# Patient Record
Sex: Male | Born: 1990 | Race: Black or African American | Hispanic: No | Marital: Single | Smoking: Current every day smoker
Health system: Southern US, Community
[De-identification: ages and names within clinical notes are randomized; demographics above are authoritative.]

## PROBLEM LIST (undated history)

## (undated) ENCOUNTER — Ambulatory Visit: Payer: 59 | Source: Home / Self Care

---

## 2020-07-28 ENCOUNTER — Other Ambulatory Visit: Payer: Self-pay

## 2020-07-28 ENCOUNTER — Emergency Department (HOSPITAL_COMMUNITY): Payer: No Typology Code available for payment source

## 2020-07-28 ENCOUNTER — Inpatient Hospital Stay (HOSPITAL_COMMUNITY): Payer: No Typology Code available for payment source

## 2020-07-28 ENCOUNTER — Inpatient Hospital Stay (HOSPITAL_COMMUNITY)
Admission: EM | Admit: 2020-07-28 | Discharge: 2020-08-02 | DRG: 958 | Disposition: A | Discharge status: Home Health | Payer: No Typology Code available for payment source | Attending: Physician Assistant | Admitting: Physician Assistant

## 2020-07-28 ENCOUNTER — Encounter (HOSPITAL_COMMUNITY): Payer: Self-pay | Admitting: Student

## 2020-07-28 DIAGNOSIS — S270XXA Traumatic pneumothorax, initial encounter: Secondary | ICD-10-CM | POA: Diagnosis present

## 2020-07-28 DIAGNOSIS — K760 Fatty (change of) liver, not elsewhere classified: Secondary | ICD-10-CM | POA: Diagnosis present

## 2020-07-28 DIAGNOSIS — Z23 Encounter for immunization: Secondary | ICD-10-CM | POA: Diagnosis not present

## 2020-07-28 DIAGNOSIS — Z6841 Body Mass Index (BMI) 40.0 and over, adult: Secondary | ICD-10-CM | POA: Diagnosis not present

## 2020-07-28 DIAGNOSIS — S32421A Displaced fracture of posterior wall of right acetabulum, initial encounter for closed fracture: Secondary | ICD-10-CM | POA: Diagnosis present

## 2020-07-28 DIAGNOSIS — S01511A Laceration without foreign body of lip, initial encounter: Secondary | ICD-10-CM | POA: Diagnosis present

## 2020-07-28 DIAGNOSIS — S32409A Unspecified fracture of unspecified acetabulum, initial encounter for closed fracture: Secondary | ICD-10-CM

## 2020-07-28 DIAGNOSIS — S301XXA Contusion of abdominal wall, initial encounter: Secondary | ICD-10-CM | POA: Diagnosis present

## 2020-07-28 DIAGNOSIS — S32401A Unspecified fracture of right acetabulum, initial encounter for closed fracture: Secondary | ICD-10-CM

## 2020-07-28 DIAGNOSIS — Y92410 Unspecified street and highway as the place of occurrence of the external cause: Secondary | ICD-10-CM

## 2020-07-28 DIAGNOSIS — D62 Acute posthemorrhagic anemia: Secondary | ICD-10-CM | POA: Diagnosis not present

## 2020-07-28 DIAGNOSIS — G4730 Sleep apnea, unspecified: Secondary | ICD-10-CM | POA: Diagnosis present

## 2020-07-28 DIAGNOSIS — Z419 Encounter for procedure for purposes other than remedying health state, unspecified: Secondary | ICD-10-CM

## 2020-07-28 DIAGNOSIS — M25531 Pain in right wrist: Secondary | ICD-10-CM | POA: Diagnosis present

## 2020-07-28 DIAGNOSIS — S2249XA Multiple fractures of ribs, unspecified side, initial encounter for closed fracture: Secondary | ICD-10-CM | POA: Diagnosis present

## 2020-07-28 DIAGNOSIS — Z20822 Contact with and (suspected) exposure to covid-19: Secondary | ICD-10-CM | POA: Diagnosis present

## 2020-07-28 DIAGNOSIS — S2243XA Multiple fractures of ribs, bilateral, initial encounter for closed fracture: Secondary | ICD-10-CM | POA: Diagnosis present

## 2020-07-28 DIAGNOSIS — T1490XA Injury, unspecified, initial encounter: Secondary | ICD-10-CM

## 2020-07-28 DIAGNOSIS — S32461A Displaced associated transverse-posterior fracture of right acetabulum, initial encounter for closed fracture: Principal | ICD-10-CM | POA: Diagnosis present

## 2020-07-28 DIAGNOSIS — M79643 Pain in unspecified hand: Secondary | ICD-10-CM

## 2020-07-28 LAB — I-STAT CHEM 8, ED
BUN: 12 mg/dL (ref 6–20)
Calcium, Ion: 1.09 mmol/L — ABNORMAL LOW (ref 1.15–1.40)
Chloride: 107 mmol/L (ref 98–111)
Creatinine, Ser: 1.4 mg/dL — ABNORMAL HIGH (ref 0.61–1.24)
Glucose, Bld: 164 mg/dL — ABNORMAL HIGH (ref 70–99)
HCT: 42 % (ref 39.0–52.0)
Hemoglobin: 14.3 g/dL (ref 13.0–17.0)
Potassium: 4.2 mmol/L (ref 3.5–5.1)
Sodium: 141 mmol/L (ref 135–145)
TCO2: 22 mmol/L (ref 22–32)

## 2020-07-28 LAB — RESP PANEL BY RT-PCR (FLU A&B, COVID) ARPGX2
Influenza A by PCR: NEGATIVE
Influenza B by PCR: NEGATIVE
SARS Coronavirus 2 by RT PCR: NEGATIVE

## 2020-07-28 LAB — CBC
HCT: 42.7 % (ref 39.0–52.0)
HCT: 40.5 % (ref 39.0–52.0)
Hemoglobin: 13.5 g/dL (ref 13.0–17.0)
Hemoglobin: 13.1 g/dL (ref 13.0–17.0)
MCH: 28.8 pg (ref 26.0–34.0)
MCH: 28.6 pg (ref 26.0–34.0)
MCHC: 31.6 g/dL (ref 30.0–36.0)
MCHC: 32.3 g/dL (ref 30.0–36.0)
MCV: 91 fL (ref 80.0–100.0)
MCV: 88.4 fL (ref 80.0–100.0)
Platelets: 317 10*3/uL (ref 150–400)
Platelets: 288 10*3/uL (ref 150–400)
RBC: 4.69 MIL/uL (ref 4.22–5.81)
RBC: 4.58 MIL/uL (ref 4.22–5.81)
RDW: 14.3 % (ref 11.5–15.5)
RDW: 14.5 % (ref 11.5–15.5)
WBC: 9.9 10*3/uL (ref 4.0–10.5)
WBC: 10.7 10*3/uL — ABNORMAL HIGH (ref 4.0–10.5)
nRBC: 0 % (ref 0.0–0.2)
nRBC: 0 % (ref 0.0–0.2)

## 2020-07-28 LAB — URINALYSIS, ROUTINE W REFLEX MICROSCOPIC
Bacteria, UA: NONE SEEN
Bilirubin Urine: NEGATIVE
Glucose, UA: NEGATIVE mg/dL
Ketones, ur: 5 mg/dL — AB
Leukocytes,Ua: NEGATIVE
Nitrite: NEGATIVE
Protein, ur: NEGATIVE mg/dL
Specific Gravity, Urine: >1.046 — ABNORMAL HIGH (ref 1.005–1.030)
pH: 5 (ref 5.0–8.0)

## 2020-07-28 LAB — COMPREHENSIVE METABOLIC PANEL
ALT: 60 U/L — ABNORMAL HIGH (ref 0–44)
AST: 58 U/L — ABNORMAL HIGH (ref 15–41)
Albumin: 4 g/dL (ref 3.5–5.0)
Alkaline Phosphatase: 44 U/L (ref 38–126)
Anion gap: 12 (ref 5–15)
BUN: 12 mg/dL (ref 6–20)
CO2: 20 mmol/L — ABNORMAL LOW (ref 22–32)
Calcium: 8.8 mg/dL — ABNORMAL LOW (ref 8.9–10.3)
Chloride: 108 mmol/L (ref 98–111)
Creatinine, Ser: 1.28 mg/dL — ABNORMAL HIGH (ref 0.61–1.24)
GFR, Estimated: >60 mL/min (ref >60)
Glucose, Bld: 165 mg/dL — ABNORMAL HIGH (ref 70–99)
Potassium: 4.2 mmol/L (ref 3.5–5.1)
Sodium: 140 mmol/L (ref 135–145)
Total Bilirubin: 0.5 mg/dL (ref 0.3–1.2)
Total Protein: 6.5 g/dL (ref 6.5–8.1)

## 2020-07-28 LAB — RAPID URINE DRUG SCREEN, HOSP PERFORMED
Amphetamines: NOT DETECTED
Barbiturates: NOT DETECTED
Benzodiazepines: NOT DETECTED
Cocaine: NOT DETECTED
Opiates: POSITIVE — AB
Tetrahydrocannabinol: NOT DETECTED

## 2020-07-28 LAB — ETHANOL: Alcohol, Ethyl (B): 172 mg/dL — ABNORMAL HIGH (ref <10)

## 2020-07-28 LAB — CREATININE, SERUM
Creatinine, Ser: 1.06 mg/dL (ref 0.61–1.24)
GFR, Estimated: >60 mL/min (ref >60)

## 2020-07-28 LAB — LACTIC ACID, PLASMA
Lactic Acid, Venous: 3.7 mmol/L (ref 0.5–1.9)
Lactic Acid, Venous: 2.2 mmol/L (ref 0.5–1.9)

## 2020-07-28 LAB — PROTIME-INR
INR: 1 (ref 0.8–1.2)
Prothrombin Time: 13.3 seconds (ref 11.4–15.2)

## 2020-07-28 LAB — HIV ANTIBODY (ROUTINE TESTING W REFLEX): HIV Screen 4th Generation wRfx: NONREACTIVE

## 2020-07-28 LAB — SAMPLE TO BLOOD BANK

## 2020-07-28 MED ORDER — GABAPENTIN 600 MG PO TABS
300.0000 mg | ORAL_TABLET | Freq: Three times a day (TID) | ORAL | Status: DC
  Administered 2020-07-28 – 2020-07-31 (×10): 300 mg via ORAL
  Filled 2020-07-28 (×13): qty 1

## 2020-07-28 MED ORDER — OXYCODONE HCL 5 MG PO TABS: 5.0000 mg | ORAL_TABLET | ORAL | Status: DC | PRN

## 2020-07-28 MED ORDER — MORPHINE SULFATE (PF) 4 MG/ML IV SOLN
4.0000 mg | Freq: Once | INTRAVENOUS | Status: AC
Start: 2020-07-28 — End: 2020-07-28
  Administered 2020-07-28: 4 mg via INTRAVENOUS
  Filled 2020-07-28: qty 1

## 2020-07-28 MED ORDER — ONDANSETRON HCL 4 MG/2ML IJ SOLN
4.0000 mg | Freq: Once | INTRAMUSCULAR | Status: AC
  Administered 2020-07-28: 4 mg via INTRAVENOUS
  Filled 2020-07-28: qty 2

## 2020-07-28 MED ORDER — BISACODYL 10 MG RE SUPP: 10.0000 mg | Freq: Every day | RECTAL | Status: DC | PRN

## 2020-07-28 MED ORDER — TETANUS-DIPHTH-ACELL PERTUSSIS 5-2.5-18.5 LF-MCG/0.5 IM SUSY
0.5000 mL | PREFILLED_SYRINGE | Freq: Once | INTRAMUSCULAR | Status: AC
  Administered 2020-07-28: 0.5 mL via INTRAMUSCULAR
  Filled 2020-07-28: qty 0.5

## 2020-07-28 MED ORDER — HYDROMORPHONE HCL 1 MG/ML IJ SOLN: 0.5000 mg | INTRAMUSCULAR | Status: DC | PRN

## 2020-07-28 MED ORDER — SODIUM CHLORIDE 0.9 % IV BOLUS
125.0000 mL | Freq: Once | INTRAVENOUS | Status: AC
  Administered 2020-07-28: 125 mL via INTRAVENOUS

## 2020-07-28 MED ORDER — KETOROLAC TROMETHAMINE 15 MG/ML IJ SOLN
15.0000 mg | Freq: Three times a day (TID) | INTRAMUSCULAR | Status: DC
  Administered 2020-07-28 – 2020-08-02 (×14): 15 mg via INTRAVENOUS
  Filled 2020-07-28 (×15): qty 1

## 2020-07-28 MED ORDER — ONDANSETRON HCL 4 MG/2ML IJ SOLN: 4.0000 mg | Freq: Four times a day (QID) | INTRAMUSCULAR | Status: DC | PRN

## 2020-07-28 MED ORDER — IOHEXOL 300 MG/ML  SOLN
100.0000 mL | Freq: Once | INTRAMUSCULAR | Status: AC | PRN
  Administered 2020-07-28: 100 mL via INTRAVENOUS

## 2020-07-28 MED ORDER — ENOXAPARIN SODIUM 30 MG/0.3ML IJ SOSY
30.0000 mg | PREFILLED_SYRINGE | Freq: Two times a day (BID) | INTRAMUSCULAR | Status: DC
  Administered 2020-07-29 (×2): 30 mg via SUBCUTANEOUS
  Filled 2020-07-28 (×3): qty 0.3

## 2020-07-28 MED ORDER — ONDANSETRON 4 MG PO TBDP: 4.0000 mg | ORAL_TABLET | Freq: Four times a day (QID) | ORAL | Status: DC | PRN

## 2020-07-28 MED ORDER — ACETAMINOPHEN 325 MG PO TABS
650.0000 mg | ORAL_TABLET | Freq: Four times a day (QID) | ORAL | Status: DC
  Administered 2020-07-28 – 2020-08-02 (×18): 650 mg via ORAL
  Filled 2020-07-28 (×21): qty 2

## 2020-07-28 MED ORDER — SODIUM CHLORIDE 0.9 % IV BOLUS
1000.0000 mL | Freq: Once | INTRAVENOUS | Status: AC
  Administered 2020-07-28: 1000 mL via INTRAVENOUS

## 2020-07-28 MED ORDER — OXYCODONE HCL 5 MG PO TABS
10.0000 mg | ORAL_TABLET | ORAL | Status: DC | PRN
  Administered 2020-07-28 – 2020-07-30 (×5): 10 mg via ORAL
  Filled 2020-07-28 (×5): qty 2

## 2020-07-28 MED ORDER — LIDOCAINE-EPINEPHRINE (PF) 2 %-1:200000 IJ SOLN
10.0000 mL | Freq: Once | INTRAMUSCULAR | Status: AC
  Administered 2020-07-28: 10 mL
  Filled 2020-07-28: qty 20

## 2020-07-28 MED ORDER — DOCUSATE SODIUM 100 MG PO CAPS
100.0000 mg | ORAL_CAPSULE | Freq: Two times a day (BID) | ORAL | Status: DC
  Administered 2020-07-28 – 2020-07-30 (×5): 100 mg via ORAL
  Filled 2020-07-28 (×7): qty 1

## 2020-07-28 NOTE — ED Notes (Signed)
Attempted to call report
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
## 2020-07-28 NOTE — ED Notes (Signed)
Attempted to call report. No answer after 5 minutes on hold
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
## 2020-07-28 NOTE — ED Notes (Signed)
Jasmine wiggins [phone] fiance would like phone calls with updates
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
## 2020-07-29 ENCOUNTER — Inpatient Hospital Stay (HOSPITAL_COMMUNITY): Payer: No Typology Code available for payment source

## 2020-07-29 LAB — CBC
HCT: 40.7 % (ref 39.0–52.0)
Hemoglobin: 13 g/dL (ref 13.0–17.0)
MCH: 28.5 pg (ref 26.0–34.0)
MCHC: 31.9 g/dL (ref 30.0–36.0)
MCV: 89.3 fL (ref 80.0–100.0)
Platelets: 312 10*3/uL (ref 150–400)
RBC: 4.56 MIL/uL (ref 4.22–5.81)
RDW: 14.6 % (ref 11.5–15.5)
WBC: 8.5 10*3/uL (ref 4.0–10.5)
nRBC: 0 % (ref 0.0–0.2)

## 2020-07-29 LAB — BASIC METABOLIC PANEL
Anion gap: 6 (ref 5–15)
BUN: 10 mg/dL (ref 6–20)
CO2: 28 mmol/L (ref 22–32)
Calcium: 8.9 mg/dL (ref 8.9–10.3)
Chloride: 105 mmol/L (ref 98–111)
Creatinine, Ser: 0.99 mg/dL (ref 0.61–1.24)
GFR, Estimated: >60 mL/min (ref >60)
Glucose, Bld: 158 mg/dL — ABNORMAL HIGH (ref 70–99)
Potassium: 3.8 mmol/L (ref 3.5–5.1)
Sodium: 139 mmol/L (ref 135–145)

## 2020-07-29 MED ORDER — METHOCARBAMOL 750 MG PO TABS
750.0000 mg | ORAL_TABLET | Freq: Four times a day (QID) | ORAL | Status: DC | PRN
  Administered 2020-07-29 – 2020-07-31 (×4): 750 mg via ORAL
  Filled 2020-07-29 (×4): qty 1
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
## 2020-07-29 NOTE — Plan of Care (Signed)
  Problem: Education: Goal: Knowledge of General Education information will improve Description: Including pain rating scale, medication(s)/side effects and non-pharmacologic comfort measures Outcome: Progressing   Problem: Clinical Measurements: Goal: Will remain free from infection Outcome: Progressing Goal: Diagnostic test results will improve Outcome: Progressing   Problem: Coping: Goal: Level of anxiety will decrease Outcome: Progressing   Problem: Elimination: Goal: Will not experience complications related to urinary retention Outcome: Progressing

## 2020-07-30 ENCOUNTER — Encounter (HOSPITAL_COMMUNITY): Admission: EM | Disposition: A | Discharge status: Home Health | Payer: Self-pay | Source: Home / Self Care

## 2020-07-30 ENCOUNTER — Inpatient Hospital Stay (HOSPITAL_COMMUNITY): Payer: No Typology Code available for payment source | Admitting: Anesthesiology

## 2020-07-30 ENCOUNTER — Inpatient Hospital Stay (HOSPITAL_COMMUNITY): Payer: No Typology Code available for payment source

## 2020-07-30 DIAGNOSIS — S32461A Displaced associated transverse-posterior fracture of right acetabulum, initial encounter for closed fracture: Secondary | ICD-10-CM | POA: Diagnosis not present

## 2020-07-30 HISTORY — PX: OPEN REDUCTION INTERNAL FIXATION ACETABULUM FRACTURE POSTERIOR: SHX6833

## 2020-07-30 LAB — BASIC METABOLIC PANEL
Anion gap: 7 (ref 5–15)
BUN: 10 mg/dL (ref 6–20)
CO2: 27 mmol/L (ref 22–32)
Calcium: 8.8 mg/dL — ABNORMAL LOW (ref 8.9–10.3)
Chloride: 104 mmol/L (ref 98–111)
Creatinine, Ser: 1 mg/dL (ref 0.61–1.24)
GFR, Estimated: >60 mL/min (ref >60)
Glucose, Bld: 125 mg/dL — ABNORMAL HIGH (ref 70–99)
Potassium: 3.9 mmol/L (ref 3.5–5.1)
Sodium: 138 mmol/L (ref 135–145)

## 2020-07-30 LAB — CBC
HCT: 40.3 % (ref 39.0–52.0)
Hemoglobin: 13.3 g/dL (ref 13.0–17.0)
MCH: 29.4 pg (ref 26.0–34.0)
MCHC: 33 g/dL (ref 30.0–36.0)
MCV: 89.2 fL (ref 80.0–100.0)
Platelets: 291 10*3/uL (ref 150–400)
RBC: 4.52 MIL/uL (ref 4.22–5.81)
RDW: 14.1 % (ref 11.5–15.5)
WBC: 8.2 10*3/uL (ref 4.0–10.5)
nRBC: 0 % (ref 0.0–0.2)

## 2020-07-30 LAB — SURGICAL PCR SCREEN
MRSA, PCR: NEGATIVE
Staphylococcus aureus: NEGATIVE

## 2020-07-30 LAB — VITAMIN D 25 HYDROXY (VIT D DEFICIENCY, FRACTURES): Vit D, 25-Hydroxy: 10.33 ng/mL — ABNORMAL LOW (ref 30–100)

## 2020-07-30 SURGERY — OPEN REDUCTION INTERNAL FIXATION ACETABULUM FRACTURE POSTERIOR
Anesthesia: General | Laterality: Right

## 2020-07-30 MED ORDER — OXYCODONE HCL 5 MG/5ML PO SOLN: 5.0000 mg | Freq: Once | ORAL | Status: AC | PRN

## 2020-07-30 MED ORDER — ROCURONIUM BROMIDE 10 MG/ML (PF) SYRINGE
PREFILLED_SYRINGE | INTRAVENOUS | Status: AC
  Filled 2020-07-30: qty 10

## 2020-07-30 MED ORDER — PROPOFOL 10 MG/ML IV BOLUS
INTRAVENOUS | Status: AC
  Filled 2020-07-30: qty 40

## 2020-07-30 MED ORDER — DEXAMETHASONE SODIUM PHOSPHATE 10 MG/ML IJ SOLN
INTRAMUSCULAR | Status: DC | PRN
  Administered 2020-07-30: 10 mg via INTRAVENOUS

## 2020-07-30 MED ORDER — VANCOMYCIN HCL 1 G IV SOLR
INTRAVENOUS | Status: DC | PRN
  Administered 2020-07-30: 1000 mg via TOPICAL

## 2020-07-30 MED ORDER — ONDANSETRON HCL 4 MG/2ML IJ SOLN
INTRAMUSCULAR | Status: AC
  Filled 2020-07-30: qty 2

## 2020-07-30 MED ORDER — ONDANSETRON HCL 4 MG/2ML IJ SOLN
INTRAMUSCULAR | Status: DC | PRN
  Administered 2020-07-30: 4 mg via INTRAVENOUS

## 2020-07-30 MED ORDER — TRANEXAMIC ACID-NACL 1000-0.7 MG/100ML-% IV SOLN
1000.0000 mg | Freq: Once | INTRAVENOUS | Status: AC
  Administered 2020-07-30: 1000 mg via INTRAVENOUS
  Filled 2020-07-30: qty 100

## 2020-07-30 MED ORDER — AMISULPRIDE (ANTIEMETIC) 5 MG/2ML IV SOLN: 10.0000 mg | Freq: Once | INTRAVENOUS | Status: DC | PRN

## 2020-07-30 MED ORDER — SODIUM CHLORIDE 0.9 % IV SOLN: INTRAVENOUS | Status: DC

## 2020-07-30 MED ORDER — MIDAZOLAM HCL 2 MG/2ML IJ SOLN
INTRAMUSCULAR | Status: AC
  Filled 2020-07-30: qty 2

## 2020-07-30 MED ORDER — HYDROMORPHONE HCL 1 MG/ML IJ SOLN
0.5000 mg | INTRAMUSCULAR | Status: DC | PRN
  Administered 2020-07-30 – 2020-07-31 (×4): 1 mg via INTRAVENOUS
  Filled 2020-07-30 (×4): qty 1

## 2020-07-30 MED ORDER — MEPERIDINE HCL 25 MG/ML IJ SOLN: 6.2500 mg | INTRAMUSCULAR | Status: DC | PRN

## 2020-07-30 MED ORDER — PROMETHAZINE HCL 25 MG/ML IJ SOLN: 6.2500 mg | INTRAMUSCULAR | Status: DC | PRN

## 2020-07-30 MED ORDER — CEFAZOLIN SODIUM-DEXTROSE 2-4 GM/100ML-% IV SOLN
2.0000 g | Freq: Three times a day (TID) | INTRAVENOUS | Status: AC
  Administered 2020-07-30 – 2020-07-31 (×3): 2 g via INTRAVENOUS
  Filled 2020-07-30 (×3): qty 100

## 2020-07-30 MED ORDER — OXYCODONE HCL 5 MG PO TABS
10.0000 mg | ORAL_TABLET | ORAL | Status: DC | PRN
  Administered 2020-07-30: 10 mg via ORAL
  Administered 2020-08-01: 15 mg via ORAL
  Administered 2020-08-02: 10 mg via ORAL
  Filled 2020-07-30: qty 2
  Filled 2020-07-30: qty 3
  Filled 2020-07-30: qty 2

## 2020-07-30 MED ORDER — ONDANSETRON HCL 4 MG PO TABS: 4.0000 mg | ORAL_TABLET | Freq: Four times a day (QID) | ORAL | Status: DC | PRN

## 2020-07-30 MED ORDER — OXYCODONE HCL 5 MG PO TABS
ORAL_TABLET | ORAL | Status: AC
  Filled 2020-07-30: qty 1

## 2020-07-30 MED ORDER — LACTATED RINGERS IV SOLN: INTRAVENOUS | Status: DC | PRN

## 2020-07-30 MED ORDER — METOCLOPRAMIDE HCL 5 MG PO TABS
5.0000 mg | ORAL_TABLET | Freq: Three times a day (TID) | ORAL | Status: DC | PRN
Start: 2020-07-30 — End: 2020-08-02

## 2020-07-30 MED ORDER — ALBUMIN HUMAN 5 % IV SOLN: INTRAVENOUS | Status: DC | PRN

## 2020-07-30 MED ORDER — OXYCODONE HCL 5 MG PO TABS
5.0000 mg | ORAL_TABLET | Freq: Once | ORAL | Status: AC | PRN
  Administered 2020-07-30: 5 mg via ORAL

## 2020-07-30 MED ORDER — ARTIFICIAL TEARS OPHTHALMIC OINT
TOPICAL_OINTMENT | OPHTHALMIC | Status: DC | PRN
  Administered 2020-07-30: 1 via OPHTHALMIC

## 2020-07-30 MED ORDER — FENTANYL CITRATE (PF) 250 MCG/5ML IJ SOLN
INTRAMUSCULAR | Status: AC
  Filled 2020-07-30: qty 5

## 2020-07-30 MED ORDER — FENTANYL CITRATE (PF) 250 MCG/5ML IJ SOLN
INTRAMUSCULAR | Status: DC | PRN
  Administered 2020-07-30 (×2): 100 ug via INTRAVENOUS
  Administered 2020-07-30: 50 ug via INTRAVENOUS
  Administered 2020-07-30: 150 ug via INTRAVENOUS

## 2020-07-30 MED ORDER — CEFAZOLIN IN SODIUM CHLORIDE 3-0.9 GM/100ML-% IV SOLN
INTRAVENOUS | Status: AC
  Filled 2020-07-30: qty 100

## 2020-07-30 MED ORDER — VANCOMYCIN HCL 1000 MG IV SOLR
INTRAVENOUS | Status: AC
  Filled 2020-07-30: qty 1000

## 2020-07-30 MED ORDER — LIDOCAINE 2% (20 MG/ML) 5 ML SYRINGE
INTRAMUSCULAR | Status: DC | PRN
  Administered 2020-07-30: 100 mg via INTRAVENOUS

## 2020-07-30 MED ORDER — TRANEXAMIC ACID-NACL 1000-0.7 MG/100ML-% IV SOLN
INTRAVENOUS | Status: AC
  Filled 2020-07-30: qty 100

## 2020-07-30 MED ORDER — OXYCODONE HCL 5 MG PO TABS
5.0000 mg | ORAL_TABLET | ORAL | Status: DC | PRN
  Administered 2020-07-31 – 2020-08-01 (×4): 10 mg via ORAL
  Filled 2020-07-30 (×4): qty 2

## 2020-07-30 MED ORDER — HYDROMORPHONE HCL 1 MG/ML IJ SOLN
0.2500 mg | INTRAMUSCULAR | Status: DC | PRN
  Administered 2020-07-30 (×2): 0.5 mg via INTRAVENOUS

## 2020-07-30 MED ORDER — SUGAMMADEX SODIUM 500 MG/5ML IV SOLN
INTRAVENOUS | Status: DC | PRN
  Administered 2020-07-30: 300 mg via INTRAVENOUS

## 2020-07-30 MED ORDER — POLYETHYLENE GLYCOL 3350 17 G PO PACK
17.0000 g | PACK | Freq: Every day | ORAL | Status: DC | PRN
  Filled 2020-07-30: qty 1

## 2020-07-30 MED ORDER — METOCLOPRAMIDE HCL 5 MG/ML IJ SOLN: 5.0000 mg | Freq: Three times a day (TID) | INTRAMUSCULAR | Status: DC | PRN

## 2020-07-30 MED ORDER — MIDAZOLAM HCL 5 MG/5ML IJ SOLN
INTRAMUSCULAR | Status: DC | PRN
  Administered 2020-07-30: 2 mg via INTRAVENOUS

## 2020-07-30 MED ORDER — TRANEXAMIC ACID-NACL 1000-0.7 MG/100ML-% IV SOLN
INTRAVENOUS | Status: DC | PRN
  Administered 2020-07-30: 1000 mg via INTRAVENOUS

## 2020-07-30 MED ORDER — PROPOFOL 10 MG/ML IV BOLUS
INTRAVENOUS | Status: DC | PRN
  Administered 2020-07-30: 200 mg via INTRAVENOUS

## 2020-07-30 MED ORDER — DOCUSATE SODIUM 100 MG PO CAPS: 100.0000 mg | ORAL_CAPSULE | Freq: Two times a day (BID) | ORAL | Status: DC

## 2020-07-30 MED ORDER — ONDANSETRON HCL 4 MG/2ML IJ SOLN: 4.0000 mg | Freq: Four times a day (QID) | INTRAMUSCULAR | Status: DC | PRN

## 2020-07-30 MED ORDER — ROCURONIUM BROMIDE 100 MG/10ML IV SOLN
INTRAVENOUS | Status: DC | PRN
  Administered 2020-07-30: 80 mg via INTRAVENOUS
  Administered 2020-07-30 (×3): 20 mg via INTRAVENOUS

## 2020-07-30 MED ORDER — HYDROMORPHONE HCL 1 MG/ML IJ SOLN
INTRAMUSCULAR | Status: AC
  Filled 2020-07-30: qty 1

## 2020-07-30 MED ORDER — 0.9 % SODIUM CHLORIDE (POUR BTL) OPTIME
TOPICAL | Status: DC | PRN
  Administered 2020-07-30: 1000 mL

## 2020-07-30 MED ORDER — STERILE WATER FOR IRRIGATION IR SOLN
Status: DC | PRN
  Administered 2020-07-30: 1000 mL

## 2020-07-30 MED ORDER — PHENYLEPHRINE HCL-NACL 10-0.9 MG/250ML-% IV SOLN
INTRAVENOUS | Status: DC | PRN
  Administered 2020-07-30: 25 ug/min via INTRAVENOUS

## 2020-07-30 MED ORDER — DEXTROSE 5 % IV SOLN
INTRAVENOUS | Status: DC | PRN
  Administered 2020-07-30: 3 g via INTRAVENOUS

## 2020-07-30 MED ORDER — SUGAMMADEX SODIUM 500 MG/5ML IV SOLN
INTRAVENOUS | Status: AC
  Filled 2020-07-30: qty 5

## 2020-07-30 MED ORDER — DEXAMETHASONE SODIUM PHOSPHATE 10 MG/ML IJ SOLN
INTRAMUSCULAR | Status: AC
  Filled 2020-07-30: qty 1

## 2020-07-30 SURGICAL SUPPLY — 75 items
BIT DRILL FLUTED 2.5 (BIT) IMPLANT
BLADE CLIPPER SURG (BLADE) IMPLANT
BLADE SURG 11 STRL SS (BLADE) ×2 IMPLANT
BRUSH SCRUB EZ PLAIN DRY (MISCELLANEOUS) ×4 IMPLANT
CHLORAPREP W/TINT 26 (MISCELLANEOUS) ×4 IMPLANT
COVER SURGICAL LIGHT HANDLE (MISCELLANEOUS) ×2 IMPLANT
COVER WAND RF STERILE (DRAPES) IMPLANT
DEFOGGER ANTIFOG KIT (MISCELLANEOUS) ×2 IMPLANT
DRAPE C-ARM 42X72 X-RAY (DRAPES) ×2 IMPLANT
DRAPE C-ARMOR (DRAPES) ×2 IMPLANT
DRAPE INCISE IOBAN 85X60 (DRAPES) ×2 IMPLANT
DRAPE ORTHO SPLIT 77X108 STRL (DRAPES) ×2
DRAPE SURG 17X23 STRL (DRAPES) ×12 IMPLANT
DRAPE SURG ORHT 6 SPLT 77X108 (DRAPES) ×2 IMPLANT
DRAPE U-SHAPE 47X51 STRL (DRAPES) ×2 IMPLANT
DRILL BIT FLUTED 2.5 (BIT) ×2
DRSG MEPILEX BORDER 4X12 (GAUZE/BANDAGES/DRESSINGS) ×1 IMPLANT
DRSG MEPILEX BORDER 4X8 (GAUZE/BANDAGES/DRESSINGS) IMPLANT
ELECT BLADE 4.0 EZ CLEAN MEGAD (MISCELLANEOUS) ×2
ELECT BLADE 6.5 EXT (BLADE) IMPLANT
ELECT REM PT RETURN 9FT ADLT (ELECTROSURGICAL) ×2
ELECTRODE BLDE 4.0 EZ CLN MEGD (MISCELLANEOUS) ×1 IMPLANT
ELECTRODE REM PT RTRN 9FT ADLT (ELECTROSURGICAL) ×1 IMPLANT
GLOVE SURG ENC MOIS LTX SZ6.5 (GLOVE) ×6 IMPLANT
GLOVE SURG ENC MOIS LTX SZ7.5 (GLOVE) ×8 IMPLANT
GLOVE SURG UNDER POLY LF SZ6.5 (GLOVE) ×2 IMPLANT
GLOVE SURG UNDER POLY LF SZ7.5 (GLOVE) ×2 IMPLANT
GOWN STRL REUS W/ TWL LRG LVL3 (GOWN DISPOSABLE) ×2 IMPLANT
GOWN STRL REUS W/TWL LRG LVL3 (GOWN DISPOSABLE) ×2
GUIDEWIRE 2.0MM (WIRE) ×2 IMPLANT
HANDPIECE INTERPULSE COAX TIP (DISPOSABLE) ×1
KIT BASIN OR (CUSTOM PROCEDURE TRAY) ×2 IMPLANT
KIT TURNOVER KIT B (KITS) ×2 IMPLANT
MANIFOLD NEPTUNE II (INSTRUMENTS) ×2 IMPLANT
NS IRRIG 1000ML POUR BTL (IV SOLUTION) ×2 IMPLANT
PACK TOTAL JOINT (CUSTOM PROCEDURE TRAY) ×2 IMPLANT
PACK UNIVERSAL I (CUSTOM PROCEDURE TRAY) ×2 IMPLANT
PAD ARMBOARD 7.5X6 YLW CONV (MISCELLANEOUS) ×4 IMPLANT
PLATE BONE 91MM 7HOLE PELVIC (Plate) ×1 IMPLANT
PLATE BONE LOCK 65MM 5 HOLE (Plate) ×1 IMPLANT
SCREW CORTEX 3.5 24MM (Screw) ×2 IMPLANT
SCREW CORTEX 3.5 28MM (Screw) ×1 IMPLANT
SCREW CORTEX 3.5 30MM (Screw) ×4 IMPLANT
SCREW CORTEX 3.5 32MM (Screw) ×1 IMPLANT
SCREW CORTEX 3.5 34MM (Screw) ×1 IMPLANT
SCREW CORTEX 3.5 40MM (Screw) ×1 IMPLANT
SCREW LOCK CORT ST 3.5X24 (Screw) IMPLANT
SCREW LOCK CORT ST 3.5X28 (Screw) IMPLANT
SCREW LOCK CORT ST 3.5X30 (Screw) IMPLANT
SCREW LOCK CORT ST 3.5X32 (Screw) IMPLANT
SCREW LOCK CORT ST 3.5X34 (Screw) IMPLANT
SCREW LOCK CORT ST 3.5X40 (Screw) IMPLANT
SET HNDPC FAN SPRY TIP SCT (DISPOSABLE) ×1 IMPLANT
SPONGE LAP 18X18 RF (DISPOSABLE) IMPLANT
STAPLER VISISTAT 35W (STAPLE) IMPLANT
SUCTION FRAZIER HANDLE 10FR (MISCELLANEOUS) ×1
SUCTION TUBE FRAZIER 10FR DISP (MISCELLANEOUS) ×1 IMPLANT
SUT ETHILON 2 0 PSLX (SUTURE) ×4 IMPLANT
SUT FIBERWIRE #2 38 T-5 BLUE (SUTURE) ×4
SUT MNCRL AB 3-0 PS2 18 (SUTURE) ×2 IMPLANT
SUT MON AB 2-0 CT1 36 (SUTURE) ×2 IMPLANT
SUT VIC AB 0 CT1 27 (SUTURE) ×1
SUT VIC AB 0 CT1 27XBRD ANBCTR (SUTURE) ×1 IMPLANT
SUT VIC AB 1 CT1 18XCR BRD 8 (SUTURE) ×1 IMPLANT
SUT VIC AB 1 CT1 27 (SUTURE) ×1
SUT VIC AB 1 CT1 27XBRD ANBCTR (SUTURE) ×1 IMPLANT
SUT VIC AB 1 CT1 8-18 (SUTURE) ×1
SUT VIC AB 2-0 CT1 27 (SUTURE) ×1
SUT VIC AB 2-0 CT1 TAPERPNT 27 (SUTURE) ×1 IMPLANT
SUTURE FIBERWR #2 38 T-5 BLUE (SUTURE) IMPLANT
TOWEL GREEN STERILE (TOWEL DISPOSABLE) ×4 IMPLANT
TOWEL GREEN STERILE FF (TOWEL DISPOSABLE) ×4 IMPLANT
TRAY FOLEY MTR SLVR 16FR STAT (SET/KITS/TRAYS/PACK) IMPLANT
UNDERPAD 30X36 HEAVY ABSORB (UNDERPADS AND DIAPERS) ×2 IMPLANT
WATER STERILE IRR 1000ML POUR (IV SOLUTION) IMPLANT

## 2020-07-30 NOTE — Anesthesia Procedure Notes (Addendum)
Procedure Name: Intubation Date/Time: 07/30/2020 7:36 AM Performed by: Renford Dills, CRNA Pre-anesthesia Checklist: Patient identified, Emergency Drugs available, Suction available and Patient being monitored Patient Re-evaluated:Patient Re-evaluated prior to induction Oxygen Delivery Method: Circle System Utilized Preoxygenation: Pre-oxygenation with 100% oxygen Induction Type: IV induction Ventilation: Two handed mask ventilation required Tube type: Oral Tube size: 8.0 mm Number of attempts: 2 Airway Equipment and Method: Stylet, Oral airway and Video-laryngoscopy Placement Confirmation: ETT inserted through vocal cords under direct vision, positive ETCO2 and breath sounds checked- equal and bilateral Secured at: 23 cm Tube secured with: Tape Dental Injury: Teeth and Oropharynx as per pre-operative assessment  Comments: Laceration on lower lip present upon arrival to Preop area.

## 2020-07-30 NOTE — Progress Notes (Signed)
RT note. Patient stated  he will place self on cpap when ready for bed. Cpap all set up for patient to use tonight.
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
## 2020-07-30 NOTE — Progress Notes (Signed)
Call returned back from Dr. Aundra Millet ,orthopedic, regarding patient's status and mews. Informed to hold lovenox this shift. No new orders at this time.

## 2020-07-30 NOTE — Anesthesia Postprocedure Evaluation (Signed)
Anesthesia Post Note  Patient: Robert Taylor  Procedure(s) Performed: OPEN REDUCTION INTERNAL FIXATION ACETABULUM FRACTURE POSTERIOR (Right)     Patient location during evaluation: PACU Anesthesia Type: General Level of consciousness: awake and alert Pain management: pain level controlled Vital Signs Assessment: post-procedure vital signs reviewed and stable Respiratory status: spontaneous breathing, nonlabored ventilation and respiratory function stable Cardiovascular status: blood pressure returned to baseline and stable Postop Assessment: no apparent nausea or vomiting Anesthetic complications: no   No notable events documented.  Last Vitals:  Vitals:   07/30/20 1145 07/30/20 1200  BP: 135/77 139/68  Pulse: 95 89  Resp: (!) 21 (!) 26  Temp:    SpO2: 100% 96%    Last Pain:  Vitals:   07/30/20 1200  TempSrc:   PainSc: 7                  Lowella Curb
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
## 2020-07-30 NOTE — Anesthesia Preprocedure Evaluation (Signed)
Anesthesia Evaluation  Patient identified by MRN, date of birth, ID band Patient awake    Reviewed: Allergy & Precautions, NPO status , Patient's Chart, lab work & pertinent test results  Airway Mallampati: II  TM Distance: >3 FB Neck ROM: Full    Dental no notable dental hx.    Pulmonary neg pulmonary ROS,    Pulmonary exam normal breath sounds clear to auscultation       Cardiovascular negative cardio ROS Normal cardiovascular exam Rhythm:Regular Rate:Normal     Neuro/Psych negative neurological ROS  negative psych ROS   GI/Hepatic negative GI ROS, Neg liver ROS,   Endo/Other  Morbid obesity  Renal/GU negative Renal ROS  negative genitourinary   Musculoskeletal negative musculoskeletal ROS (+)   Abdominal (+) + obese,   Peds negative pediatric ROS (+)  Hematology negative hematology ROS (+)   Anesthesia Other Findings   Reproductive/Obstetrics negative OB ROS                             Anesthesia Physical Anesthesia Plan  ASA: 3  Anesthesia Plan: General   Post-op Pain Management:    Induction: Intravenous  PONV Risk Score and Plan: 2 and Ondansetron, Midazolam and Treatment may vary due to age or medical condition  Airway Management Planned: Oral ETT  Additional Equipment:   Intra-op Plan:   Post-operative Plan: Extubation in OR  Informed Consent: I have reviewed the patients History and Physical, chart, labs and discussed the procedure including the risks, benefits and alternatives for the proposed anesthesia with the patient or authorized representative who has indicated his/her understanding and acceptance.     Dental advisory given  Plan Discussed with: CRNA  Anesthesia Plan Comments:        Anesthesia Quick Evaluation  
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
## 2020-07-30 NOTE — Progress Notes (Signed)
Pt arrived on unit. Dressing moderately saturated. Reinforced dressing with foam dressing and medipore tape.

## 2020-07-30 NOTE — Progress Notes (Signed)
   07/30/20 2004  Assess: MEWS Score  Temp 98.4 F (36.9 C)  BP (!) 120/56 (correction)  Pulse Rate (!) 113  Resp 20  SpO2 98 % (correction)  O2 Device Nasal Cannula  O2 Flow Rate (L/min) 2 L/min  Assess: MEWS Score  MEWS Temp 0  MEWS Systolic 0  MEWS Pulse 2  MEWS RR 0  MEWS LOC 0  MEWS Score 2  MEWS Score Color Yellow  Assess: if the MEWS score is Yellow or Red  Were vital signs taken at a resting state? Yes  Focused Assessment Change from prior assessment (see assessment flowsheet)  Early Detection of Sepsis Score *See Row Information* Low  MEWS guidelines implemented *See Row Information* Yes  Treat  MEWS Interventions Administered scheduled meds/treatments  Pain Scale 0-10  Pain Score 5  Pain Type Acute pain  Pain Location Hip  Pain Orientation Right  Pain Descriptors / Indicators Aching  Pain Frequency Constant  Pain Onset With Activity  Patients Stated Pain Goal 3  Pain Intervention(s) Medication (See eMAR)  Take Vital Signs  Increase Vital Sign Frequency  Yellow: Q 2hr X 2 then Q 4hr X 2, if remains yellow, continue Q 4hrs  Escalate  MEWS: Escalate Yellow: discuss with charge nurse/RN and consider discussing with provider and RRT  Notify: Charge Nurse/RN  Name of Charge Nurse/RN Notified Celso,RN  Date Charge Nurse/RN Notified 07/30/20  Time Charge Nurse/RN Notified 2050  Notify: Provider  Provider Name/Title Dr J.Daniels  Date Provider Notified 07/30/20  Time Provider Notified 2045  Notification Type Page  Notification Reason Change in status  Provider response Other (Comment) (awaiting orders)
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
## 2020-07-30 NOTE — Transfer of Care (Signed)
Immediate Anesthesia Transfer of Care Note  Patient: Robert Taylor  Procedure(s) Performed: OPEN REDUCTION INTERNAL FIXATION ACETABULUM FRACTURE POSTERIOR (Right)  Patient Location: PACU  Anesthesia Type:General  Level of Consciousness: sedated  Airway & Oxygen Therapy: Patient Spontanous Breathing and Patient connected to face mask oxygen  Post-op Assessment: Report given to RN and Post -op Vital signs reviewed and stable  Post vital signs: Reviewed and stable  Last Vitals:  Vitals Value Taken Time  BP 134/74 07/30/20 1100  Temp    Pulse 86 07/30/20 1104  Resp 35 07/30/20 1104  SpO2 97 % 07/30/20 1104  Vitals shown include unvalidated device data.  Last Pain:  Vitals:   07/30/20 0639  TempSrc: Oral  PainSc:       Patients Stated Pain Goal: 3 (07/29/20 1022)  Complications: No notable events documented.

## 2020-07-31 ENCOUNTER — Encounter (HOSPITAL_COMMUNITY): Payer: Self-pay | Admitting: Student

## 2020-07-31 LAB — BASIC METABOLIC PANEL
Anion gap: 5 (ref 5–15)
BUN: 10 mg/dL (ref 6–20)
CO2: 27 mmol/L (ref 22–32)
Calcium: 8.6 mg/dL — ABNORMAL LOW (ref 8.9–10.3)
Chloride: 103 mmol/L (ref 98–111)
Creatinine, Ser: 1 mg/dL (ref 0.61–1.24)
GFR, Estimated: >60 mL/min (ref >60)
Glucose, Bld: 139 mg/dL — ABNORMAL HIGH (ref 70–99)
Potassium: 4.5 mmol/L (ref 3.5–5.1)
Sodium: 135 mmol/L (ref 135–145)

## 2020-07-31 LAB — CBC
HCT: 25.2 % — ABNORMAL LOW (ref 39.0–52.0)
Hemoglobin: 8.4 g/dL — ABNORMAL LOW (ref 13.0–17.0)
MCH: 29.8 pg (ref 26.0–34.0)
MCHC: 33.3 g/dL (ref 30.0–36.0)
MCV: 89.4 fL (ref 80.0–100.0)
Platelets: 235 10*3/uL (ref 150–400)
RBC: 2.82 MIL/uL — ABNORMAL LOW (ref 4.22–5.81)
RDW: 13.8 % (ref 11.5–15.5)
WBC: 9.4 10*3/uL (ref 4.0–10.5)
nRBC: 0 % (ref 0.0–0.2)

## 2020-07-31 MED ORDER — ENOXAPARIN SODIUM 30 MG/0.3ML IJ SOSY
30.0000 mg | PREFILLED_SYRINGE | Freq: Two times a day (BID) | INTRAMUSCULAR | Status: DC
Start: 2020-07-31 — End: 2020-07-31

## 2020-07-31 MED ORDER — VITAMIN D (ERGOCALCIFEROL) 1.25 MG (50000 UNIT) PO CAPS
50000.0000 [IU] | ORAL_CAPSULE | ORAL | Status: DC
  Administered 2020-07-31: 50000 [IU] via ORAL
  Filled 2020-07-31: qty 1

## 2020-07-31 MED ORDER — ENOXAPARIN SODIUM 30 MG/0.3ML IJ SOSY
30.0000 mg | PREFILLED_SYRINGE | Freq: Two times a day (BID) | INTRAMUSCULAR | Status: DC
  Administered 2020-08-01 – 2020-08-02 (×3): 30 mg via SUBCUTANEOUS
  Filled 2020-07-31 (×3): qty 0.3

## 2020-07-31 NOTE — Plan of Care (Signed)
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
## 2020-07-31 NOTE — Progress Notes (Signed)
RT note. Patient on cpap at this time
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
## 2020-07-31 NOTE — Progress Notes (Signed)
Foley catheter removed per order. No distress noted.

## 2020-07-31 NOTE — Progress Notes (Signed)
Orthopedic Tech Progress Note Patient Details:  Robert Taylor 03/13/1990 [id number]  Spoke with RN about patient needing an OVER HEAD FRAME WITH TRAPEZE, in house frames max weight it 113.3 kg (250)lbs.  Patient ID: Robert Taylor, male   DOB: [birth year], 30 y.o.   MRN: [id number]  Robert Taylor 07/31/2020, 7:50 AM
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
## 2020-08-01 DIAGNOSIS — S32461A Displaced associated transverse-posterior fracture of right acetabulum, initial encounter for closed fracture: Secondary | ICD-10-CM | POA: Diagnosis not present

## 2020-08-01 LAB — HEMOGLOBIN AND HEMATOCRIT, BLOOD
HCT: 22.7 % — ABNORMAL LOW (ref 39.0–52.0)
Hemoglobin: 7.6 g/dL — ABNORMAL LOW (ref 13.0–17.0)

## 2020-08-01 LAB — PREPARE RBC (CROSSMATCH)

## 2020-08-01 LAB — BASIC METABOLIC PANEL
Anion gap: 5 (ref 5–15)
BUN: 11 mg/dL (ref 6–20)
CO2: 27 mmol/L (ref 22–32)
Calcium: 8.5 mg/dL — ABNORMAL LOW (ref 8.9–10.3)
Chloride: 104 mmol/L (ref 98–111)
Creatinine, Ser: 0.84 mg/dL (ref 0.61–1.24)
GFR, Estimated: >60 mL/min (ref >60)
Glucose, Bld: 136 mg/dL — ABNORMAL HIGH (ref 70–99)
Potassium: 4.3 mmol/L (ref 3.5–5.1)
Sodium: 136 mmol/L (ref 135–145)

## 2020-08-01 LAB — CBC
HCT: 20.8 % — ABNORMAL LOW (ref 39.0–52.0)
Hemoglobin: 6.7 g/dL — CL (ref 13.0–17.0)
MCH: 29.1 pg (ref 26.0–34.0)
MCHC: 32.2 g/dL (ref 30.0–36.0)
MCV: 90.4 fL (ref 80.0–100.0)
Platelets: 265 10*3/uL (ref 150–400)
RBC: 2.3 MIL/uL — ABNORMAL LOW (ref 4.22–5.81)
RDW: 14.3 % (ref 11.5–15.5)
WBC: 9.7 10*3/uL (ref 4.0–10.5)
nRBC: 0 % (ref 0.0–0.2)

## 2020-08-01 LAB — ABO/RH: ABO/RH(D): A POS

## 2020-08-01 MED ORDER — SODIUM CHLORIDE 0.9% IV SOLUTION: Freq: Once | INTRAVENOUS | Status: DC

## 2020-08-01 MED ORDER — SODIUM CHLORIDE 0.9% IV SOLUTION: Freq: Once | INTRAVENOUS | Status: AC

## 2020-08-01 MED ORDER — GABAPENTIN 300 MG PO CAPS
300.0000 mg | ORAL_CAPSULE | Freq: Three times a day (TID) | ORAL | Status: DC
  Administered 2020-08-01 – 2020-08-02 (×4): 300 mg via ORAL
  Filled 2020-08-01 (×4): qty 1

## 2020-08-01 MED ORDER — WHITE PETROLATUM EX OINT
TOPICAL_OINTMENT | CUTANEOUS | Status: AC
  Filled 2020-08-01: qty 28.35

## 2020-08-01 NOTE — Progress Notes (Signed)
Patient had a much better day than yesterday. He states his pain is better controlled today with the current regimen. Patient tolerated PT well and has been up with his SO several times to the bathroom without complication. Hgb now 7.6.

## 2020-08-01 NOTE — TOC Progression Note (Signed)
Transition of Care [facility]) - Progression Note    Patient Details  Name: Robert Taylor MRN: [id number] Date of Birth: [birth year]  Transition of [facility]) CM/SW Contact  Glennon Mac, RN Phone Number: 08/01/2020, 1:47 PM  Clinical Narrative:  Able to secure home health PT/OT with [facility]. DME has been delivered to room.  Confirmed address/phone number with patient's spouse.       Expected Discharge Plan: Home w Home Health Services Barriers to Discharge: Continued Medical Work up  Expected Discharge Plan and Services Expected Discharge Plan: Home w Home Health Services   Discharge Planning Services: CM Consult Post Acute Care Choice: Home Health Living arrangements for the past 2 months: Single Family Home                 DME Arranged: 3-N-1, Walker rolling (Bariatric) DME Agency: [employer] Date DME Agency Contacted: 07/31/20 Time DME Agency Contacted: 1630 Representative spoke with at DME Agency: Velna Hatchet HH Arranged: PT, OT HH Agency: [facility]   Time [facility] Agency Contacted: 1346 Representative spoke with at [facility] Agency: Health visitor   Social Determinants of Health (SDOH) Interventions    Readmission Risk Interventions No flowsheet data found.  Quintella Baton, RN, BSN  Trauma/Neuro ICU Case Manager [phone]
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
## 2020-08-01 NOTE — Plan of Care (Signed)
  Problem: Education: Goal: Knowledge of General Education information will improve Description: Including pain rating scale, medication(s)/side effects and non-pharmacologic comfort measures Outcome: Progressing   Problem: Health Behavior/Discharge Planning: Goal: Ability to manage health-related needs will improve Outcome: Progressing   Problem: Clinical Measurements: Goal: Ability to maintain clinical measurements within normal limits will improve Outcome: Progressing Goal: Will remain free from infection Outcome: Progressing Goal: Diagnostic test results will improve Outcome: Progressing Goal: Respiratory complications will improve Outcome: Progressing Goal: Cardiovascular complication will be avoided Outcome: Progressing   Problem: Clinical Measurements: Goal: Will remain free from infection Outcome: Progressing   Problem: Activity: Goal: Risk for activity intolerance will decrease Outcome: Progressing   Problem: Nutrition: Goal: Adequate nutrition will be maintained Outcome: Progressing   Problem: Coping: Goal: Level of anxiety will decrease Outcome: Progressing   Problem: Elimination: Goal: Will not experience complications related to bowel motility Outcome: Progressing Goal: Will not experience complications related to urinary retention Outcome: Progressing
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
## 2020-08-01 NOTE — Progress Notes (Signed)
Patient using hospital CPAP independently. Will call if he needs help.
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
## 2020-08-02 ENCOUNTER — Other Ambulatory Visit: Payer: Self-pay | Admitting: Student

## 2020-08-02 ENCOUNTER — Encounter: Payer: Self-pay | Admitting: Student

## 2020-08-02 LAB — CBC
HCT: 21.7 % — ABNORMAL LOW (ref 39.0–52.0)
Hemoglobin: 7.2 g/dL — ABNORMAL LOW (ref 13.0–17.0)
MCH: 29.4 pg (ref 26.0–34.0)
MCHC: 33.2 g/dL (ref 30.0–36.0)
MCV: 88.6 fL (ref 80.0–100.0)
Platelets: 261 10*3/uL (ref 150–400)
RBC: 2.45 MIL/uL — ABNORMAL LOW (ref 4.22–5.81)
RDW: 15.3 % (ref 11.5–15.5)
WBC: 10.4 10*3/uL (ref 4.0–10.5)
nRBC: 0.5 % — ABNORMAL HIGH (ref 0.0–0.2)

## 2020-08-02 LAB — BASIC METABOLIC PANEL
Anion gap: 6 (ref 5–15)
BUN: 10 mg/dL (ref 6–20)
CO2: 29 mmol/L (ref 22–32)
Calcium: 8.8 mg/dL — ABNORMAL LOW (ref 8.9–10.3)
Chloride: 103 mmol/L (ref 98–111)
Creatinine, Ser: 0.83 mg/dL (ref 0.61–1.24)
GFR, Estimated: >60 mL/min (ref >60)
Glucose, Bld: 129 mg/dL — ABNORMAL HIGH (ref 70–99)
Potassium: 4.3 mmol/L (ref 3.5–5.1)
Sodium: 138 mmol/L (ref 135–145)

## 2020-08-02 MED ORDER — GABAPENTIN 300 MG PO CAPS
300.0000 mg | ORAL_CAPSULE | Freq: Three times a day (TID) | ORAL | 0 refills | Status: DC
Start: 2020-08-02 — End: 2020-09-19

## 2020-08-02 MED ORDER — ACETAMINOPHEN 325 MG PO TABS: 650.0000 mg | ORAL_TABLET | Freq: Four times a day (QID) | ORAL | Status: AC | PRN

## 2020-08-02 MED ORDER — OXYCODONE HCL 10 MG PO TABS: 10.0000 mg | ORAL_TABLET | Freq: Four times a day (QID) | ORAL | 0 refills | Status: DC | PRN

## 2020-08-02 MED ORDER — METHOCARBAMOL 750 MG PO TABS: 750.0000 mg | ORAL_TABLET | Freq: Four times a day (QID) | ORAL | 0 refills | Status: DC | PRN

## 2020-08-02 MED ORDER — VITAMIN D (ERGOCALCIFEROL) 1.25 MG (50000 UNIT) PO CAPS: 50000.0000 [IU] | ORAL_CAPSULE | ORAL | 0 refills | Status: AC
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
## 2020-08-02 NOTE — Plan of Care (Signed)
  Problem: Education: Goal: Knowledge of General Education information will improve Description: Including pain rating scale, medication(s)/side effects and non-pharmacologic comfort measures 08/02/2020 1050 by Quintella Baton, RN Outcome: Adequate for Discharge 08/02/2020 1050 by Quintella Baton, RN Outcome: Progressing   Problem: Health Behavior/Discharge Planning: Goal: Ability to manage health-related needs will improve 08/02/2020 1050 by Garlon Hatchet A, RN Outcome: Adequate for Discharge 08/02/2020 1050 by Garlon Hatchet A, RN Outcome: Progressing   Problem: Clinical Measurements: Goal: Ability to maintain clinical measurements within normal limits will improve 08/02/2020 1050 by Quintella Baton, RN Outcome: Adequate for Discharge 08/02/2020 1050 by Garlon Hatchet A, RN Outcome: Progressing Goal: Will remain free from infection 08/02/2020 1050 by Quintella Baton, RN Outcome: Adequate for Discharge 08/02/2020 1050 by Quintella Baton, RN Outcome: Progressing Goal: Diagnostic test results will improve 08/02/2020 1050 by Quintella Baton, RN Outcome: Adequate for Discharge 08/02/2020 1050 by Garlon Hatchet A, RN Outcome: Progressing Goal: Respiratory complications will improve 08/02/2020 1050 by Quintella Baton, RN Outcome: Adequate for Discharge 08/02/2020 1050 by Garlon Hatchet A, RN Outcome: Progressing Goal: Cardiovascular complication will be avoided 08/02/2020 1050 by Quintella Baton, RN Outcome: Adequate for Discharge 08/02/2020 1050 by Quintella Baton, RN Outcome: Progressing   Problem: Activity: Goal: Risk for activity intolerance will decrease 08/02/2020 1050 by Quintella Baton, RN Outcome: Adequate for Discharge 08/02/2020 1050 by Garlon Hatchet A, RN Outcome: Progressing   Problem: Nutrition: Goal: Adequate nutrition will be maintained 08/02/2020 1050 by Quintella Baton, RN Outcome: Adequate for Discharge 08/02/2020 1050 by Garlon Hatchet A, RN Outcome:  Progressing   Problem: Coping: Goal: Level of anxiety will decrease 08/02/2020 1050 by Garlon Hatchet A, RN Outcome: Adequate for Discharge 08/02/2020 1050 by Garlon Hatchet A, RN Outcome: Progressing   Problem: Elimination: Goal: Will not experience complications related to bowel motility 08/02/2020 1050 by Quintella Baton, RN Outcome: Adequate for Discharge 08/02/2020 1050 by Garlon Hatchet A, RN Outcome: Progressing Goal: Will not experience complications related to urinary retention 08/02/2020 1050 by Quintella Baton, RN Outcome: Adequate for Discharge 08/02/2020 1050 by Quintella Baton, RN Outcome: Progressing   Problem: Pain Managment: Goal: General experience of comfort will improve 08/02/2020 1050 by Quintella Baton, RN Outcome: Adequate for Discharge 08/02/2020 1050 by Garlon Hatchet A, RN Outcome: Progressing   Problem: Safety: Goal: Ability to remain free from injury will improve 08/02/2020 1050 by Quintella Baton, RN Outcome: Adequate for Discharge 08/02/2020 1050 by Garlon Hatchet A, RN Outcome: Progressing   Problem: Skin Integrity: Goal: Risk for impaired skin integrity will decrease 08/02/2020 1050 by Quintella Baton, RN Outcome: Adequate for Discharge 08/02/2020 1050 by Quintella Baton, RN Outcome: Progressing

## 2020-08-02 NOTE — Discharge Instructions (Signed)
Find out which primary care providers in this area are covered by your insurance carrier if you do not already have a PCP. You will need to establish care to get a referral for formal sleep study for your sleep apnea. They can also help with pain management from rib fractures moving forward.

## 2020-08-02 NOTE — Plan of Care (Signed)
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
## 2020-08-03 LAB — TYPE AND SCREEN
ABO/RH(D): A POS
Antibody Screen: NEGATIVE
Unit division: 0
Unit division: 0

## 2020-08-03 LAB — BPAM RBC
Blood Product Expiration Date: 202207112359
Blood Product Expiration Date: 202207222359
ISSUE DATE / TIME: 202206290947
Unit Type and Rh: 6200
Unit Type and Rh: 6200

## 2020-08-20 ENCOUNTER — Ambulatory Visit: Payer: 59 | Admitting: Internal Medicine

## 2020-08-24 ENCOUNTER — Ambulatory Visit: Payer: 59 | Admitting: Family Medicine

## 2020-09-19 ENCOUNTER — Ambulatory Visit (INDEPENDENT_AMBULATORY_CARE_PROVIDER_SITE_OTHER): Payer: 59 | Admitting: Internal Medicine

## 2020-09-19 ENCOUNTER — Other Ambulatory Visit: Payer: Self-pay

## 2020-09-19 VITALS — BP 136/75 | HR 96 | Temp 97.9°F | Resp 18 | Ht 69.0 in | Wt 290.0 lb

## 2020-09-19 DIAGNOSIS — Z6841 Body Mass Index (BMI) 40.0 and over, adult: Secondary | ICD-10-CM

## 2020-09-19 DIAGNOSIS — F17218 Nicotine dependence, cigarettes, with other nicotine-induced disorders: Secondary | ICD-10-CM

## 2020-09-19 DIAGNOSIS — G4710 Hypersomnia, unspecified: Secondary | ICD-10-CM
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
## 2020-09-19 NOTE — Patient Instructions (Signed)
Hypersomnia Hypersomnia is a condition in which a person feels very tired during the day even though he or she gets plenty of sleep at night. A person with this condition may take naps during the day and may find it very difficult to wake up from sleep. Hypersomnia may affect a person's ability to think, concentrate,drive, or remember things. What are the causes? The cause of this condition may not be known. Possible causes include: Certain medicines. Sleep disorders, such as narcolepsy and sleep apnea. Injury to the head, brain, or spinal cord. Drug or alcohol use. Gastroesophageal reflux disease (GERD). Tumors. Certain medical conditions, such as depression, diabetes, or an underactive thyroid gland (hypothyroidism). What are the signs or symptoms? The main symptoms of hypersomnia include: Feeling very tired throughout the day, regardless of how much sleep you got the night before. Having trouble waking up. Others may find it difficult to wake you up when you are sleeping. Sleeping for longer and longer periods at a time. Taking naps throughout the day. Other symptoms may include: Feeling restless, anxious, or annoyed. Lacking energy. Having trouble with: Remembering. Speaking. Thinking. Loss of appetite. Seeing, hearing, tasting, smelling, or feeling things that are not real (hallucinations). How is this diagnosed? This condition may be diagnosed based on: Your symptoms and medical history. Your sleeping habits. Your health care provider may ask you to write down your sleeping habits in a daily sleep log, along with any symptoms you have. A series of tests that are done while you sleep (sleep study or polysomnogram). A test that measures how quickly you can fall asleep during the day (daytime nap study or multiple sleep latency test). How is this treated? Treatment can help you manage your condition. Treatment may include: Following a regular sleep routine. Lifestyle changes,  such as changing your eating habits, getting regular exercise, and avoiding alcohol or caffeinated beverages. Taking medicines to make you more alert (stimulants) during the day. Treating any underlying medical causes of hypersomnia. Follow these instructions at home: Sleep routine  Schedule the same bedtime and wake-up time each day. Practice a relaxing bedtime routine. This may include reading, meditation, deep breathing, or taking a warm bath before going to sleep. Get regular exercise each day. Avoid strenuous exercise in the evening hours. Keep your sleep environment at a cooler temperature, darkened, and quiet. Sleep with pillows and a mattress that are comfortable and supportive. Schedule short 20-minute naps for when you feel sleepiest during the day. Talk with your employer or teachers about your hypersomnia. If possible, adjust your schedule so that: You have a regular daytime work schedule. You can take a scheduled nap during the day. You do not have to work or be active at night. Do not eat a heavy meal for a few hours before bedtime. Eat your meals at about the same times every day. Avoid drinking alcohol or caffeinated beverages.  Safety  Do not drive or use heavy machinery if you are sleepy. Ask your health care provider if it is safe for you to drive. Wear a life jacket when swimming or spending time near water.  General instructions Take supplements and over-the-counter and prescription medicines only as told by your health care provider. Keep a sleep log that will help your doctor manage your condition. This may include information about: What time you go to bed each night. How often you wake up at night. How many hours you sleep at night. How often and for how long you nap during the   day. Any observations from others, such as leg movements during sleep, sleep walking, or snoring. Keep all follow-up visits as told by your health care provider. This is  important. Contact a health care provider if: You have new symptoms. Your symptoms get worse. Get help right away if: You have serious thoughts about hurting yourself or someone else. If you ever feel like you may hurt yourself or others, or have thoughts about taking your own life, get help right away. You can go to your nearest emergency department or call: Your local emergency services (911 in the [place] A suicide crisis helpline, such as the National Suicide Prevention Lifeline at 1-800-273-8255. This is open 24 hours a day. Summary Hypersomnia refers to a condition in which you feel very tired during the day even though you get plenty of sleep at night. A person with this condition may take naps during the day and may find it very difficult to wake up from sleep. Hypersomnia may affect a person's ability to think, concentrate, drive, or remember things. Treatment, such as following a regular sleep routine and making some lifestyle changes, can help you manage your condition. This information is not intended to replace advice given to you by your health care provider. Make sure you discuss any questions you have with your healthcare provider. Document Revised: 12/01/2019 Document Reviewed: 12/01/2019 Elsevier Patient Education  2022 Elsevier Inc.  

## 2022-02-25 ENCOUNTER — Ambulatory Visit: Payer: 59 | Admitting: Family

## 2022-02-25 NOTE — Progress Notes (Deleted)
   New Patient Office Visit  Subjective:  Patient ID: Robert Taylor, male    DOB: 1990/08/21  Age: 32 y.o. MRN: [id number]  CC: No chief complaint on file.   HPI Robert Taylor presents for establishing care today.  Assessment & Plan:   Problem List Items Addressed This Visit   None   Subjective:    Outpatient Medications Prior to Visit  Medication Sig Dispense Refill  . acetaminophen (TYLENOL) 325 MG tablet Take 2 tablets (650 mg total) by mouth every 6 (six) hours as needed for mild pain or headache.    . Vitamin D, Ergocalciferol, (DRISDOL) 1.25 MG (50000 UNIT) CAPS capsule Take 1 capsule (50,000 Units total) by mouth every 7 (seven) days. 5 capsule 0   No facility-administered medications prior to visit.   No past medical history on file. Past Surgical History:  Procedure Laterality Date  . OPEN REDUCTION INTERNAL FIXATION ACETABULUM FRACTURE POSTERIOR Right 07/30/2020   Procedure: OPEN REDUCTION INTERNAL FIXATION ACETABULUM FRACTURE POSTERIOR;  Surgeon: Shona Needles, MD;  Location: [facility];  Service: Orthopedics;  Laterality: Right;    Objective:   Today's Vitals: There were no vitals taken for this visit.  Physical Exam Vitals and nursing note reviewed.  Constitutional:      General: He is not in acute distress.    Appearance: Normal appearance.  HENT:     Head: Normocephalic.  Cardiovascular:     Rate and Rhythm: Normal rate and regular rhythm.  Pulmonary:     Effort: Pulmonary effort is normal.     Breath sounds: Normal breath sounds.  Musculoskeletal:        General: Normal range of motion.     Cervical back: Normal range of motion.  Skin:    General: Skin is warm and dry.  Neurological:     Mental Status: He is alert and oriented to person, place, and time.  Psychiatric:        Mood and Affect: Mood normal.   No orders of the defined types were placed in this encounter.   Jeanie Sewer, NP

## 2022-04-01 ENCOUNTER — Ambulatory Visit: Payer: 59 | Admitting: Emergency Medicine

## 2022-05-06 ENCOUNTER — Telehealth: Payer: 59 | Admitting: Family Medicine

## 2022-05-06 ENCOUNTER — Telehealth: Payer: 59

## 2022-05-06 DIAGNOSIS — J069 Acute upper respiratory infection, unspecified: Secondary | ICD-10-CM | POA: Diagnosis not present

## 2022-05-06 MED ORDER — FLUTICASONE PROPIONATE 50 MCG/ACT NA SUSP: 2.0000 | Freq: Every day | NASAL | 0 refills | Status: DC

## 2022-05-06 MED ORDER — PSEUDOEPH-BROMPHEN-DM 30-2-10 MG/5ML PO SYRP: 5.0000 mL | ORAL_SOLUTION | Freq: Four times a day (QID) | ORAL | 0 refills | Status: AC | PRN

## 2022-05-06 MED ORDER — BENZONATATE 100 MG PO CAPS: 100.0000 mg | ORAL_CAPSULE | Freq: Three times a day (TID) | ORAL | 0 refills | Status: DC | PRN

## 2022-05-06 NOTE — Patient Instructions (Addendum)
Robert Taylor, thank you for joining Perlie Mayo, NP for today's virtual visit.  While this provider is not your primary care provider (PCP), if your PCP is located in our provider database this encounter information will be shared with them immediately following your visit.   [place] account gives you access to today's visit and all your visits, tests, and labs performed at [facility] " click here if you don't have a [facility] account or go to [email or web address]  Consent: (Patient) Robert Taylor provided verbal consent for this virtual visit at the beginning of the encounter.  Current Medications:  Current Outpatient Medications:    benzonatate (TESSALON) 100 MG capsule, Take 1 capsule (100 mg total) by mouth 3 (three) times daily as needed for cough., Disp: 30 capsule, Rfl: 0   brompheniramine-pseudoephedrine-DM 30-2-10 MG/5ML syrup, Take 5 mLs by mouth 4 (four) times daily as needed., Disp: 120 mL, Rfl: 0   fluticasone (FLONASE) 50 MCG/ACT nasal spray, Place 2 sprays into both nostrils daily., Disp: 16 g, Rfl: 0   acetaminophen (TYLENOL) 325 MG tablet, Take 2 tablets (650 mg total) by mouth every 6 (six) hours as needed for mild pain or headache., Disp: , Rfl:    Vitamin D, Ergocalciferol, (DRISDOL) 1.25 MG (50000 UNIT) CAPS capsule, Take 1 capsule (50,000 Units total) by mouth every 7 (seven) days., Disp: 5 capsule, Rfl: 0   Medications ordered in this encounter:  Meds ordered this encounter  Medications   brompheniramine-pseudoephedrine-DM 30-2-10 MG/5ML syrup    Sig: Take 5 mLs by mouth 4 (four) times daily as needed.    Dispense:  120 mL    Refill:  0    Order Specific Question:   Supervising Provider    Answer:   Chase Picket [id number]   fluticasone (FLONASE) 50 MCG/ACT nasal spray    Sig: Place 2 sprays into both nostrils daily.    Dispense:  16 g    Refill:  0    Order Specific Question:   Supervising Provider    Answer:    Chase Picket [id number]   benzonatate (TESSALON) 100 MG capsule    Sig: Take 1 capsule (100 mg total) by mouth 3 (three) times daily as needed for cough.    Dispense:  30 capsule    Refill:  0    Order Specific Question:   Supervising Provider    Answer:   Chase Picket [id number]     *If you need refills on other medications prior to your next appointment, please contact your pharmacy*  Follow-Up: Call back or seek an in-person evaluation if the symptoms worsen or if the condition fails to improve as anticipated.  [place] [phone]  Other Instructions  Allergies, Adult An allergy is when your body reacts to something that bothers it (allergen). Allergies often affect the nose, eyes, skin, and stomach. They cannot spread from person to person. Allergies can start at any age. Sometimes, they go away as you get older. What are the causes? Outdoor things, like pollen, car fumes, and mold. Indoor things, like dust, smoke, mold, and pets. Foods. Medicines. Things that bother your skin. These include perfumes and bug bites. What increases the risk? Having family members with allergies or asthma. What are the signs or symptoms? Allergies may cause: A runny nose, stuffy nose, or sneezing. An itchy mouth, ears, or throat. A feeling of mucus dripping down the back of your throat. A sore throat.  Eyes that itch or are red, watery, or puffy. A skin rash or red, swollen areas of skin (hives). Stomach cramps or bloating. If you have a very bad allergic reaction (anaphylactic reaction) to food, medicine, or bug bites, you may also have: A red face. Coughing. Swollen lips, tongue, or mouth. A tight or swollen throat. Chest pain. Your chest may feel tight. Your heart may beat too fast. Trouble breathing. Pain in your belly (abdomen). You may vomit or have watery poop (diarrhea). Fainting. Or you may feel dizzy. If you have a very bad reaction to an allergy,  you should get help right away. How is this treated?     You may need to use: Cold, wet cloths. These can help with itching and swelling. Eye drops, nose sprays, or skin creams. A saline solution to wash out your nose. Saline solutions are made of salt and water. A humidifier. Medicines. You may need to change the foods you eat. You may be given allergy shots, or a small bit of the allergen may be put under your tongue. These treatments can help your body get used to the allergen. If you have a very bad allergic reaction, you may need to use an auto-injector pen. An auto-injector pen is a device filled with medicine. It gives you an emergency shot of epinephrine. Your doctor will teach you how to use it. Follow these instructions at home: Medicines  Take or apply over-the-counter and prescription medicines only as told by your doctor. Keep an auto-injector pen with you all the time if you might have a very bad allergic reaction. Eating and drinking Follow instructions from your doctor about what you may eat and drink. Drink enough fluid to keep your pee (urine) pale yellow. General instructions If you have ever had a bad reaction, wear a medical alert bracelet or necklace. Stay away from the things you are allergic to. Keep all follow-up visits. Your doctor will check on how you are doing and talk about treatment options with you. Contact a doctor if: You do not get better with treatment. Get help right away if: You have a very bad allergic reaction. You use your auto-injector pen. You must go to the hospital even if the medicine seems to be working. These symptoms may be an emergency. Use the auto-injector pen right away. Then call 911. Do not wait to see if the symptoms will go away. Do not drive yourself to the hospital. This information is not intended to replace advice given to you by your health care provider. Make sure you discuss any questions you have with your health  care provider. Document Revised: 10/02/2021 Document Reviewed: 10/02/2021 Elsevier Patient Education  [place].   If you have been instructed to have an in-person evaluation today at a local Urgent Care facility, please use the link below. It will take you to a list of all of our available [facility], including address, phone number and hours of operation. Please do not delay care.  [facility]  If you or a family member do not have a primary care provider, use the link below to schedule a visit and establish care. When you choose a [facility] primary care physician or advanced practice provider, you gain a long-term partner in health. Find a Primary Care Provider  Learn more about 's in-office and virtual care options: [facility]
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
## 2022-05-07 ENCOUNTER — Telehealth: Payer: 59 | Admitting: Nurse Practitioner

## 2022-05-07 DIAGNOSIS — J111 Influenza due to unidentified influenza virus with other respiratory manifestations: Secondary | ICD-10-CM | POA: Diagnosis not present

## 2022-05-07 MED ORDER — OSELTAMIVIR PHOSPHATE 75 MG PO CAPS: 75.0000 mg | ORAL_CAPSULE | Freq: Two times a day (BID) | ORAL | 0 refills | Status: AC
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
## 2022-07-11 ENCOUNTER — Ambulatory Visit: Payer: 59 | Admitting: Family Medicine

## 2022-08-12 ENCOUNTER — Ambulatory Visit: Payer: 59 | Admitting: Internal Medicine

## 2022-08-28 ENCOUNTER — Ambulatory Visit: Payer: 59 | Admitting: Internal Medicine
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
## 2023-01-31 IMAGING — RF DG C-ARM 1-60 MIN
1 series · 7 of 7 positions shown · non-contrast
Comparison: July 28, 2020.

CLINICAL DATA: Open reduction and internal fixation of right
acetabular fracture.

EXAM:
JUDET PELVIS - 3+ VIEW; DG C-ARM 1-60 MIN
Radiation exposure index: 26.19 mGy.

[Series 1: run · 7 of 7 slices shown]
[im 1/7]
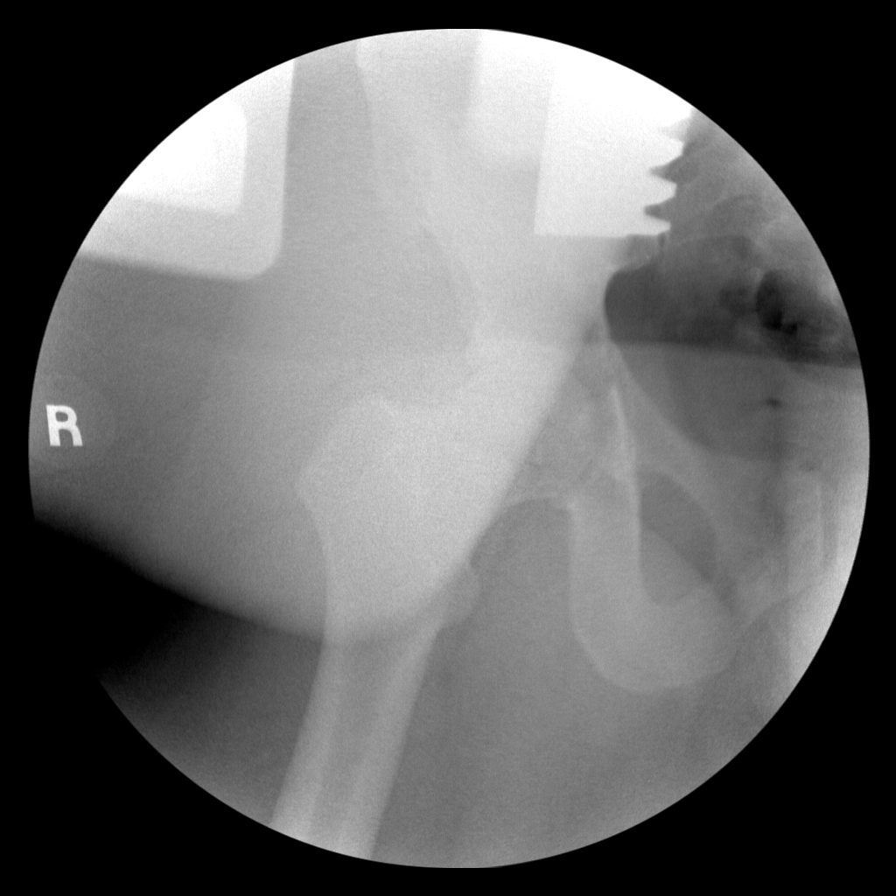
[im 2/7]
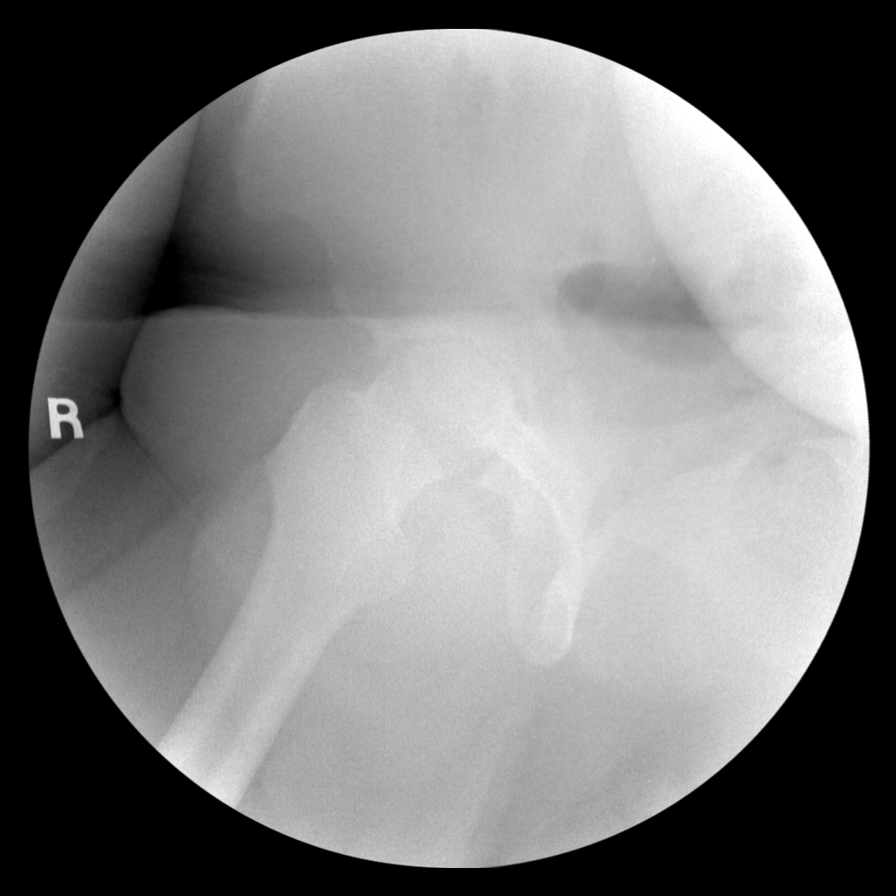
[im 3/7]
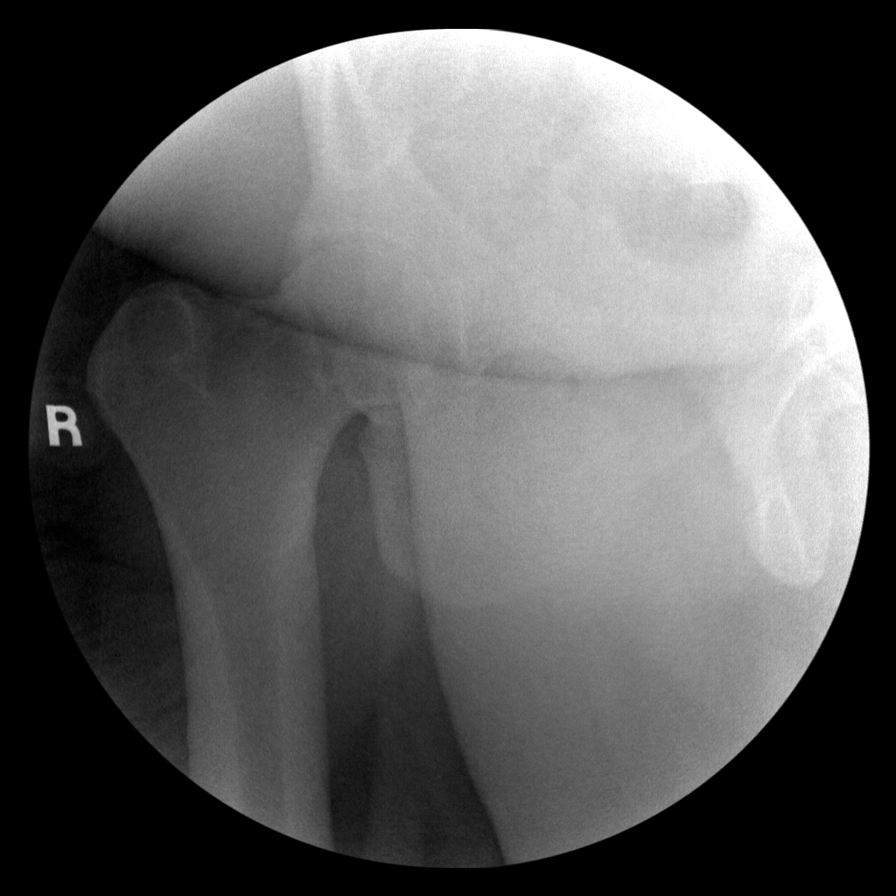
[im 4/7]
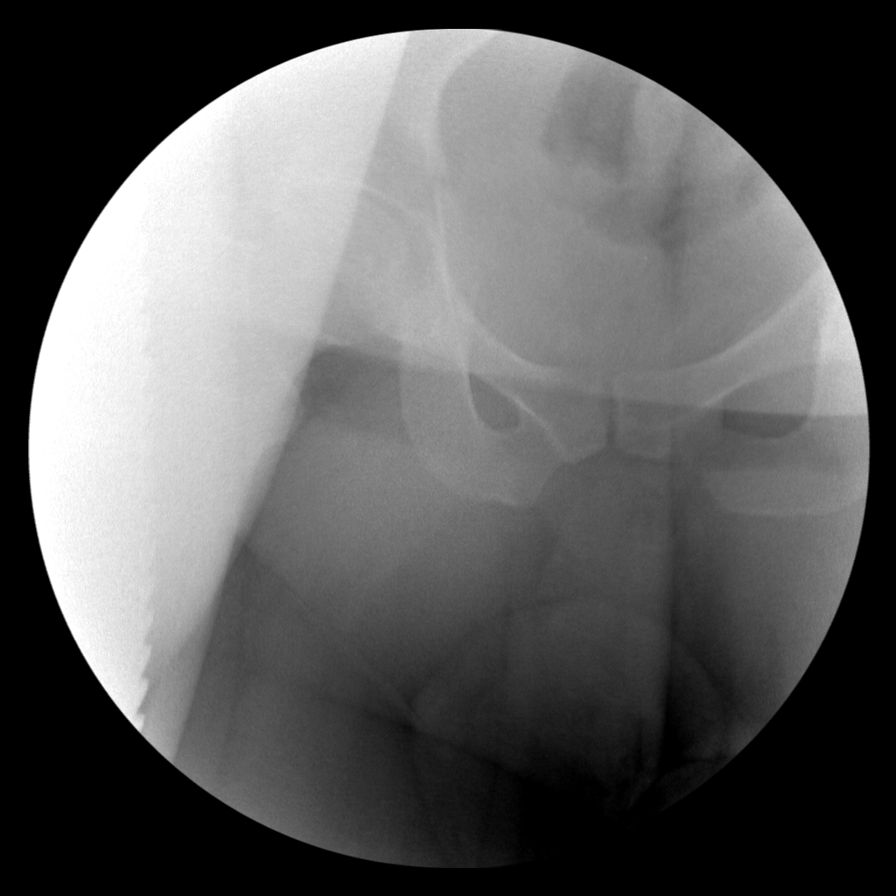
[im 5/7]
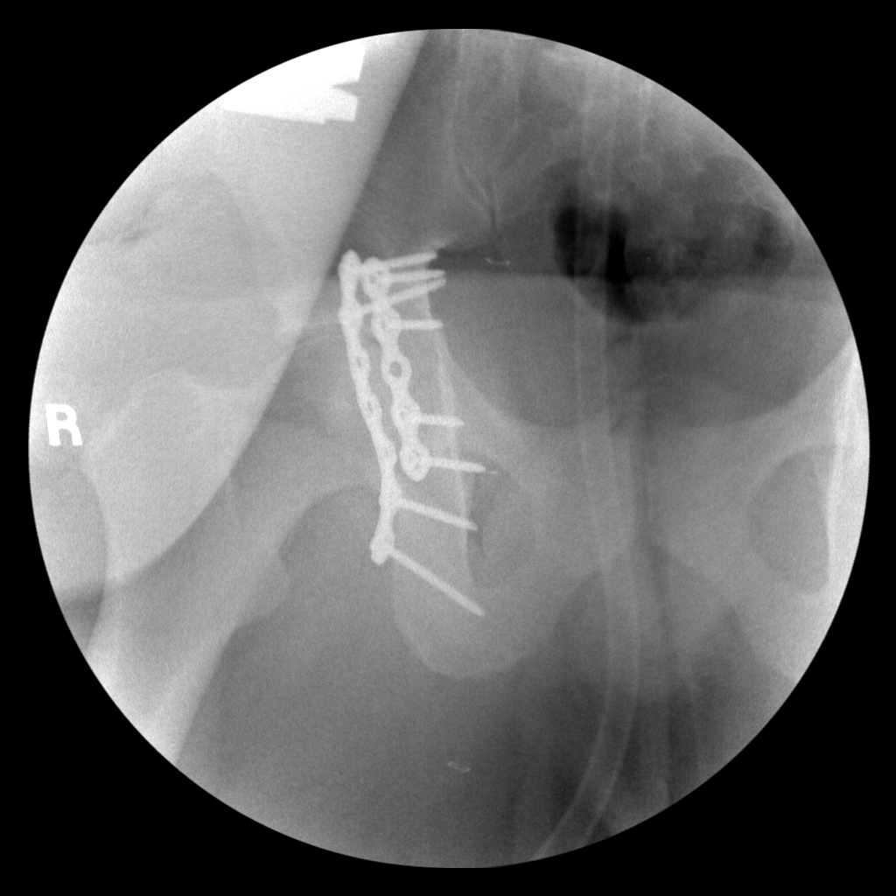
[im 6/7]
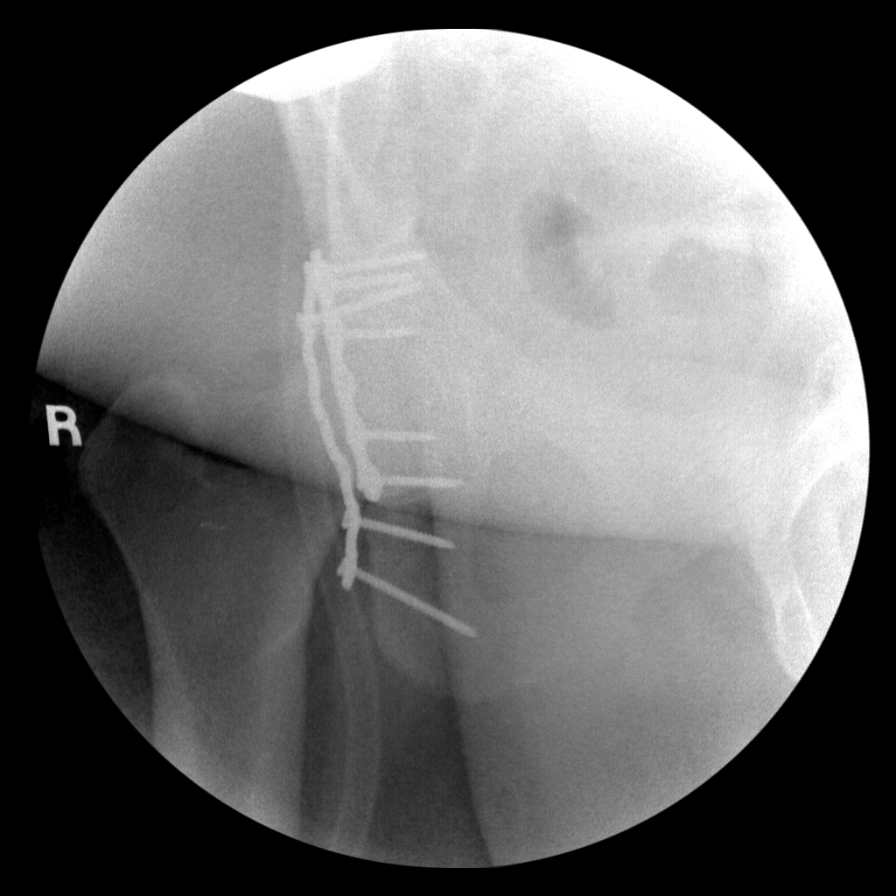
[im 7/7]
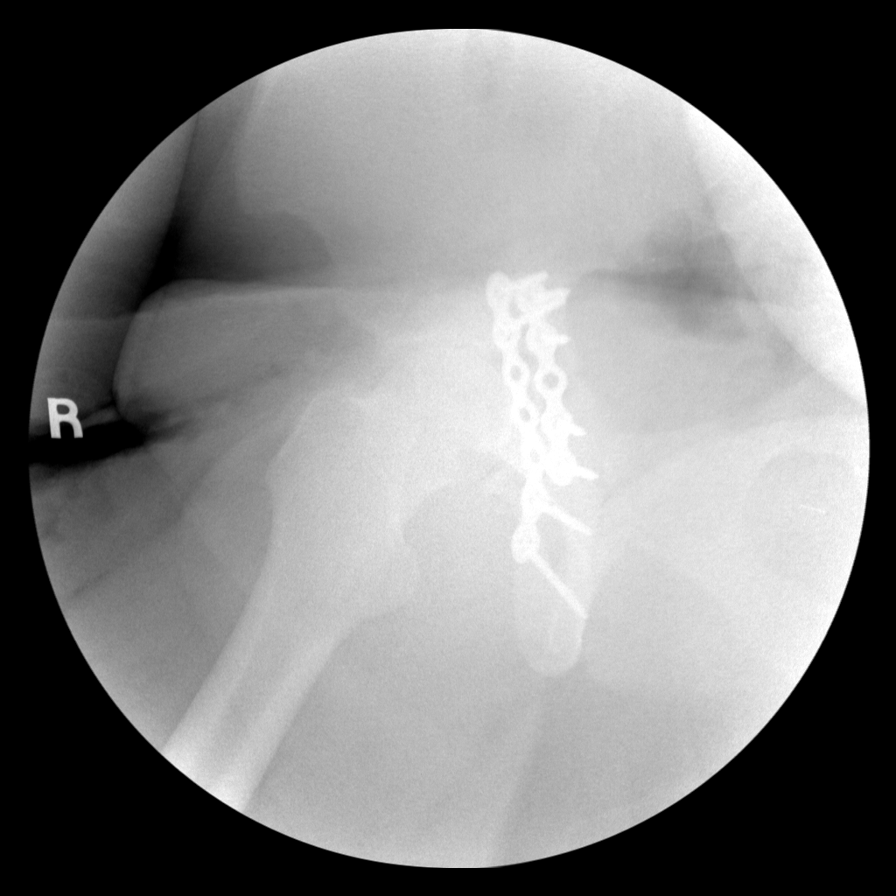

[7 of 7 positions shown; findings below may reference images not displayed]

FINDINGS: Seven intraoperative fluoroscopic images were obtained of the right
pelvis. These images demonstrate surgical internal fixation of right
acetabular fracture. Good alignment of fracture components is noted.
IMPRESSION: Fluoroscopic guidance provided during right pelvic surgery.

## 2023-03-07 ENCOUNTER — Other Ambulatory Visit: Payer: Self-pay

## 2023-03-07 ENCOUNTER — Ambulatory Visit
Admission: EM | Admit: 2023-03-07 | Discharge: 2023-03-07 | Disposition: A | Discharge status: Home / Self Care | Payer: 59

## 2023-03-07 ENCOUNTER — Encounter: Payer: Self-pay | Admitting: Emergency Medicine

## 2023-03-07 DIAGNOSIS — H66001 Acute suppurative otitis media without spontaneous rupture of ear drum, right ear: Secondary | ICD-10-CM | POA: Diagnosis not present

## 2023-03-07 MED ORDER — AMOXICILLIN-POT CLAVULANATE 875-125 MG PO TABS: 1.0000 | ORAL_TABLET | Freq: Two times a day (BID) | ORAL | 0 refills | Status: AC

## 2023-03-07 MED ORDER — FLUTICASONE PROPIONATE 50 MCG/ACT NA SUSP: 1.0000 | Freq: Every day | NASAL | 0 refills | Status: AC
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
## 2023-03-07 NOTE — Discharge Instructions (Signed)
Start Augmentin twice daily for 10 days.  This will replace your amoxicillin that was previously prescribed.  Use Tylenol and ibuprofen for pain.  I recommend over-the-counter allergy medication for additional symptom relief including Flonase nasal spray.  If your symptoms are not improving or if anything worsens you need to be seen immediately including high fever, worsening pain, drainage from your ear, nausea, vomiting.

## 2023-03-07 NOTE — ED Triage Notes (Signed)
Pt here for right ear fullness x 3 days with some "ringing" starting yesterday

## 2023-03-27 ENCOUNTER — Ambulatory Visit: Payer: 59 | Admitting: Family

## 2023-03-27 VITALS — BP 132/84 | HR 76 | Temp 98.1°F | Resp 18 | Ht 68.0 in | Wt 319.2 lb

## 2023-03-27 DIAGNOSIS — Z6841 Body Mass Index (BMI) 40.0 and over, adult: Secondary | ICD-10-CM | POA: Diagnosis not present

## 2023-03-27 DIAGNOSIS — Z0000 Encounter for general adult medical examination without abnormal findings: Secondary | ICD-10-CM

## 2023-03-27 DIAGNOSIS — H6691 Otitis media, unspecified, right ear: Secondary | ICD-10-CM

## 2023-03-27 DIAGNOSIS — Z1159 Encounter for screening for other viral diseases: Secondary | ICD-10-CM

## 2023-03-27 DIAGNOSIS — Z131 Encounter for screening for diabetes mellitus: Secondary | ICD-10-CM

## 2023-03-27 DIAGNOSIS — Z1329 Encounter for screening for other suspected endocrine disorder: Secondary | ICD-10-CM

## 2023-03-27 DIAGNOSIS — Z7689 Persons encountering health services in other specified circumstances: Secondary | ICD-10-CM

## 2023-03-27 DIAGNOSIS — Z130 Encounter for screening for diseases of the blood and blood-forming organs and certain disorders involving the immune mechanism: Secondary | ICD-10-CM

## 2023-03-27 DIAGNOSIS — Z13220 Encounter for screening for lipoid disorders: Secondary | ICD-10-CM

## 2023-03-27 DIAGNOSIS — N529 Male erectile dysfunction, unspecified: Secondary | ICD-10-CM | POA: Diagnosis not present

## 2023-03-27 DIAGNOSIS — E669 Obesity, unspecified: Secondary | ICD-10-CM | POA: Diagnosis not present

## 2023-03-27 DIAGNOSIS — Z13228 Encounter for screening for other metabolic disorders: Secondary | ICD-10-CM

## 2023-03-27 MED ORDER — SILDENAFIL CITRATE 50 MG PO TABS: ORAL_TABLET | ORAL | 2 refills | Status: AC

## 2023-03-27 MED ORDER — PHENTERMINE HCL 15 MG PO CAPS: 15.0000 mg | ORAL_CAPSULE | ORAL | 0 refills | Status: AC
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
## 2023-03-27 NOTE — Progress Notes (Signed)
 Patient is here to established care with provider. ~health hx address ~care gaps address

## 2023-03-28 LAB — CBC
Hematocrit: 45.9 % (ref 37.5–51.0)
Hemoglobin: 14.7 g/dL (ref 13.0–17.7)
MCH: 28.8 pg (ref 26.6–33.0)
MCHC: 32 g/dL (ref 31.5–35.7)
MCV: 90 fL (ref 79–97)
Platelets: 328 10*3/uL (ref 150–450)
RBC: 5.1 x10E6/uL (ref 4.14–5.80)
RDW: 12.7 % (ref 11.6–15.4)
WBC: 5.8 10*3/uL (ref 3.4–10.8)

## 2023-03-28 LAB — LIPID PANEL
Chol/HDL Ratio: 5.6 ratio — ABNORMAL HIGH (ref 0.0–5.0)
Cholesterol, Total: 213 mg/dL — ABNORMAL HIGH (ref 100–199)
HDL: 38 mg/dL — ABNORMAL LOW (ref >39)
LDL Chol Calc (NIH): 163 mg/dL — ABNORMAL HIGH (ref 0–99)
Triglycerides: 66 mg/dL (ref 0–149)
VLDL Cholesterol Cal: 12 mg/dL (ref 5–40)

## 2023-03-28 LAB — CMP14+EGFR
ALT: 50 IU/L — ABNORMAL HIGH (ref 0–44)
AST: 38 IU/L (ref 0–40)
Albumin: 4.7 g/dL (ref 4.1–5.1)
Alkaline Phosphatase: 70 IU/L (ref 44–121)
BUN/Creatinine Ratio: 12 (ref 9–20)
BUN: 11 mg/dL (ref 6–20)
Bilirubin Total: 0.4 mg/dL (ref 0.0–1.2)
CO2: 21 mmol/L (ref 20–29)
Calcium: 9.6 mg/dL (ref 8.7–10.2)
Chloride: 102 mmol/L (ref 96–106)
Creatinine, Ser: 0.89 mg/dL (ref 0.76–1.27)
Globulin, Total: 2.4 g/dL (ref 1.5–4.5)
Glucose: 108 mg/dL — ABNORMAL HIGH (ref 70–99)
Potassium: 4.6 mmol/L (ref 3.5–5.2)
Sodium: 139 mmol/L (ref 134–144)
Total Protein: 7.1 g/dL (ref 6.0–8.5)
eGFR: 115 mL/min/{1.73_m2} (ref >59)

## 2023-03-28 LAB — HEPATITIS C ANTIBODY: Hep C Virus Ab: NONREACTIVE

## 2023-03-28 LAB — TSH: TSH: 1.42 u[IU]/mL (ref 0.450–4.500)

## 2023-03-28 LAB — HEMOGLOBIN A1C
Est. average glucose Bld gHb Est-mCnc: 160 mg/dL
Hgb A1c MFr Bld: 7.2 % — ABNORMAL HIGH (ref 4.8–5.6)

## 2023-03-30 ENCOUNTER — Encounter: Payer: Self-pay | Admitting: Family

## 2023-03-30 ENCOUNTER — Other Ambulatory Visit: Payer: Self-pay | Admitting: Family

## 2023-03-30 ENCOUNTER — Other Ambulatory Visit: Payer: Self-pay

## 2023-03-30 DIAGNOSIS — E785 Hyperlipidemia, unspecified: Secondary | ICD-10-CM

## 2023-03-30 DIAGNOSIS — E1165 Type 2 diabetes mellitus with hyperglycemia: Secondary | ICD-10-CM

## 2023-03-30 DIAGNOSIS — Z13228 Encounter for screening for other metabolic disorders: Secondary | ICD-10-CM

## 2023-03-30 MED ORDER — METFORMIN HCL ER 500 MG PO TB24: 500.0000 mg | ORAL_TABLET | Freq: Every day | ORAL | 1 refills | Status: DC

## 2023-03-30 MED ORDER — ATORVASTATIN CALCIUM 20 MG PO TABS: 20.0000 mg | ORAL_TABLET | Freq: Every day | ORAL | 0 refills | Status: DC

## 2023-04-24 ENCOUNTER — Ambulatory Visit: Payer: 59 | Admitting: Family

## 2023-05-15 ENCOUNTER — Encounter: Admitting: Family

## 2023-05-15 NOTE — Progress Notes (Signed)
 Erroneous encounter-disregard

## 2023-06-23 ENCOUNTER — Encounter: Admitting: Family

## 2023-06-23 NOTE — Progress Notes (Signed)
 Erroneous encounter-disregard

## 2023-09-16 ENCOUNTER — Encounter: Admitting: Family

## 2023-09-16 NOTE — Progress Notes (Signed)
 Erroneous encounter-disregard

## 2023-11-20 ENCOUNTER — Ambulatory Visit: Admitting: Family

## 2023-11-23 ENCOUNTER — Ambulatory Visit: Payer: Self-pay | Admitting: Family

## 2023-11-23 ENCOUNTER — Encounter: Payer: Self-pay | Admitting: Family

## 2023-11-23 ENCOUNTER — Ambulatory Visit: Admitting: Family

## 2023-11-23 VITALS — BP 162/118 | HR 88 | Temp 98.2°F | Resp 18 | Ht 68.0 in | Wt 325.4 lb

## 2023-11-23 DIAGNOSIS — R030 Elevated blood-pressure reading, without diagnosis of hypertension: Secondary | ICD-10-CM | POA: Diagnosis not present

## 2023-11-23 DIAGNOSIS — R399 Unspecified symptoms and signs involving the genitourinary system: Secondary | ICD-10-CM

## 2023-11-23 DIAGNOSIS — E1165 Type 2 diabetes mellitus with hyperglycemia: Secondary | ICD-10-CM | POA: Diagnosis not present

## 2023-11-23 DIAGNOSIS — E785 Hyperlipidemia, unspecified: Secondary | ICD-10-CM | POA: Diagnosis not present

## 2023-11-23 DIAGNOSIS — K219 Gastro-esophageal reflux disease without esophagitis: Secondary | ICD-10-CM

## 2023-11-23 DIAGNOSIS — R748 Abnormal levels of other serum enzymes: Secondary | ICD-10-CM

## 2023-11-23 DIAGNOSIS — Z13228 Encounter for screening for other metabolic disorders: Secondary | ICD-10-CM

## 2023-11-23 LAB — POCT URINALYSIS DIP (CLINITEK)
Bilirubin, UA: NEGATIVE
Glucose, UA: 100 mg/dL — AB
Ketones, POC UA: NEGATIVE mg/dL
Leukocytes, UA: NEGATIVE
Nitrite, UA: NEGATIVE
POC PROTEIN,UA: 30 — AB
Spec Grav, UA: >=1.03 — AB (ref 1.010–1.025)
Urobilinogen, UA: 0.2 U/dL
pH, UA: 5.5 (ref 5.0–8.0)

## 2023-11-23 MED ORDER — OMEPRAZOLE 20 MG PO CPDR: 20.0000 mg | DELAYED_RELEASE_CAPSULE | Freq: Every day | ORAL | 0 refills | Status: AC

## 2023-11-23 NOTE — Progress Notes (Signed)
 Heartburns,  waking up in the middle of the night urination 3 to 4 times
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
## 2023-11-24 LAB — LIPID PANEL
Chol/HDL Ratio: 5.9 ratio — ABNORMAL HIGH (ref 0.0–5.0)
Cholesterol, Total: 266 mg/dL — ABNORMAL HIGH (ref 100–199)
HDL: 45 mg/dL (ref >39)
LDL Chol Calc (NIH): 186 mg/dL — ABNORMAL HIGH (ref 0–99)
Triglycerides: 188 mg/dL — ABNORMAL HIGH (ref 0–149)
VLDL Cholesterol Cal: 35 mg/dL (ref 5–40)

## 2023-11-24 LAB — ALT: ALT: 53 IU/L — ABNORMAL HIGH (ref 0–44)

## 2023-11-24 LAB — HEMOGLOBIN A1C
Est. average glucose Bld gHb Est-mCnc: 223 mg/dL
Hgb A1c MFr Bld: 9.4 % — ABNORMAL HIGH (ref 4.8–5.6)

## 2023-11-24 MED ORDER — ATORVASTATIN CALCIUM 20 MG PO TABS: 20.0000 mg | ORAL_TABLET | Freq: Every day | ORAL | 0 refills | Status: AC

## 2023-11-24 MED ORDER — METFORMIN HCL ER 500 MG PO TB24: 500.0000 mg | ORAL_TABLET | Freq: Two times a day (BID) | ORAL | 0 refills | Status: AC

## 2023-11-26 LAB — URINE CULTURE

## 2023-12-01 ENCOUNTER — Ambulatory Visit
Admission: RE | Admit: 2023-12-01 | Discharge: 2023-12-01 | Disposition: A | Discharge status: Home / Self Care | Source: Ambulatory Visit | Attending: Physician Assistant | Admitting: Physician Assistant

## 2023-12-01 ENCOUNTER — Other Ambulatory Visit: Payer: Self-pay

## 2023-12-01 VITALS — BP 151/98 | HR 84 | Temp 97.9°F | Resp 20 | Ht 68.0 in | Wt 325.4 lb

## 2023-12-01 DIAGNOSIS — L02212 Cutaneous abscess of back [any part, except buttock]: Secondary | ICD-10-CM

## 2023-12-01 DIAGNOSIS — L03312 Cellulitis of back [any part except buttock]: Secondary | ICD-10-CM | POA: Diagnosis not present

## 2023-12-01 MED ORDER — SULFAMETHOXAZOLE-TRIMETHOPRIM 800-160 MG PO TABS: 1.0000 | ORAL_TABLET | Freq: Two times a day (BID) | ORAL | 0 refills | Status: AC

## 2023-12-01 NOTE — ED Triage Notes (Signed)
 Pt presents with a chief complaint of skin problem. States there is a bump and rash present on his left shoulder blade. Currently rates overall pain an 8/10. OTC hydrocortisone cream applied with no improvement/relief. Burning sensation following application. No drainage noted from bump. Unable to sleep last night due to the pain.

## 2023-12-01 NOTE — Discharge Instructions (Addendum)
 VISIT SUMMARY:  You came in today because of a painful bump on your left shoulder that you noticed on Sunday. The bump is swollen and painful, making it difficult for you to lift your shoulder. You have not had any injuries or trauma to the shoulder, and there has been no drainage or bleeding from the site. You tried using hydrocortisone cream, but it did not help.  YOUR PLAN:  -LEFT SHOULDER ABSCESS WITH CELLULITIS: You have a small abscess with surrounding cellulitis on your left shoulder. This means there is a collection of pus under the skin with some inflammation around it. We will treat it with antibiotics and pain relief measures. You should take Bactrim (sulfamethoxazole and trimethoprim) orally twice daily for 7 days. For pain, alternate between acetaminophen  and ibuprofen as needed. Applying warm compresses to the area can also help with discomfort. Please take probiotics or yogurt to help prevent stomach upset from the antibiotics. If the abscess forms a head, increases in size, becomes more painful, or if you experience significant drainage, numbness, tingling, or fever, please return for further evaluation.  INSTRUCTIONS:  Please take the prescribed antibiotics as directed and use pain relievers as needed. Apply warm compresses to the affected area to help with discomfort. You have been given a work note for 2 days off. Return to the clinic if the abscess forms a head, increases in size, becomes more painful, or if there is significant drainage, numbness, tingling, or fever.
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
## 2023-12-02 ENCOUNTER — Other Ambulatory Visit: Payer: Self-pay | Admitting: Family

## 2023-12-02 DIAGNOSIS — I10 Essential (primary) hypertension: Secondary | ICD-10-CM

## 2023-12-02 MED ORDER — VALSARTAN 40 MG PO TABS: 40.0000 mg | ORAL_TABLET | Freq: Every day | ORAL | 0 refills | Status: AC

## 2023-12-02 NOTE — Telephone Encounter (Signed)
 Complete

## 2023-12-18 ENCOUNTER — Ambulatory Visit: Admitting: Family

## 2023-12-28 ENCOUNTER — Ambulatory Visit: Admitting: Family

## 2024-01-08 ENCOUNTER — Encounter: Admitting: Family

## 2024-01-08 NOTE — Progress Notes (Signed)
 Erroneous encounter-disregard

## 2024-02-12 ENCOUNTER — Encounter: Admitting: Family

## 2024-02-12 NOTE — Progress Notes (Signed)
 Erroneous encounter-disregard
